# Patient Record
Sex: Female | Born: 1955 | Race: Black or African American | Hispanic: No | Smoking: Current every day smoker
Health system: Southern US, Community
[De-identification: ages and names within clinical notes are randomized; demographics above are authoritative.]

## PROBLEM LIST (undated history)

## (undated) DIAGNOSIS — I10 Essential (primary) hypertension: Secondary | ICD-10-CM

## (undated) HISTORY — PX: TOTAL HIP ARTHROPLASTY: SHX124

## (undated) HISTORY — DX: Essential (primary) hypertension: I10

---
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
## 2000-01-30 ENCOUNTER — Other Ambulatory Visit: Admission: RE | Admit: 2000-01-30 | Discharge: 2000-01-30 | Payer: Self-pay | Admitting: Family Medicine

## 2000-02-05 ENCOUNTER — Encounter: Payer: Self-pay | Admitting: Family Medicine

## 2000-02-05 ENCOUNTER — Encounter: Admission: RE | Admit: 2000-02-05 | Discharge: 2000-02-05 | Payer: Self-pay | Admitting: Family Medicine

## 2008-02-01 ENCOUNTER — Other Ambulatory Visit: Admission: RE | Admit: 2008-02-01 | Discharge: 2008-02-01 | Payer: Self-pay | Admitting: Family Medicine

## 2010-10-18 ENCOUNTER — Inpatient Hospital Stay (INDEPENDENT_AMBULATORY_CARE_PROVIDER_SITE_OTHER)
Admission: RE | Admit: 2010-10-18 | Discharge: 2010-10-18 | Disposition: A | Discharge status: Home / Self Care | Payer: Self-pay | Source: Ambulatory Visit | Attending: Family Medicine | Admitting: Family Medicine

## 2010-10-18 DIAGNOSIS — H81399 Other peripheral vertigo, unspecified ear: Secondary | ICD-10-CM

## 2011-05-14 ENCOUNTER — Inpatient Hospital Stay (INDEPENDENT_AMBULATORY_CARE_PROVIDER_SITE_OTHER)
Admission: RE | Admit: 2011-05-14 | Discharge: 2011-05-14 | Disposition: A | Discharge status: Home / Self Care | Payer: BC Managed Care – PPO | Source: Ambulatory Visit | Attending: Family Medicine | Admitting: Family Medicine

## 2011-05-14 ENCOUNTER — Ambulatory Visit (INDEPENDENT_AMBULATORY_CARE_PROVIDER_SITE_OTHER): Payer: BC Managed Care – PPO

## 2011-05-14 DIAGNOSIS — S92309A Fracture of unspecified metatarsal bone(s), unspecified foot, initial encounter for closed fracture: Secondary | ICD-10-CM

## 2012-12-05 ENCOUNTER — Emergency Department (HOSPITAL_COMMUNITY)
Admission: EM | Admit: 2012-12-05 | Discharge: 2012-12-05 | Disposition: A | Discharge status: Home / Self Care | Payer: BC Managed Care – PPO | Source: Home / Self Care | Attending: Emergency Medicine | Admitting: Emergency Medicine

## 2012-12-05 ENCOUNTER — Encounter (HOSPITAL_COMMUNITY): Payer: Self-pay | Admitting: *Deleted

## 2012-12-05 DIAGNOSIS — I10 Essential (primary) hypertension: Secondary | ICD-10-CM

## 2012-12-05 MED ORDER — VERAPAMIL HCL ER 240 MG PO TBCR: 240.0000 mg | EXTENDED_RELEASE_TABLET | Freq: Every day | ORAL | Status: DC

## 2012-12-05 NOTE — Discharge Instructions (Signed)
Arterial Hypertension °Arterial hypertension (high blood pressure) is a condition of elevated pressure in your blood vessels. Hypertension over a long period of time is a risk factor for strokes, heart attacks, and heart failure. It is also the leading cause of kidney (renal) failure.  °CAUSES  °· In Adults -- Over 90% of all hypertension has no known cause. This is called essential or primary hypertension. In the other 10% of people with hypertension, the increase in blood pressure is caused by another disorder. This is called secondary hypertension. Important causes of secondary hypertension are: °· Heavy alcohol use. °· Obstructive sleep apnea. °· Hyperaldosterosim (Conn's syndrome). °· Steroid use. °· Chronic kidney failure. °· Hyperparathyroidism. °· Medications. °· Renal artery stenosis. °· Pheochromocytoma. °· Cushing's disease. °· Coarctation of the aorta. °· Scleroderma renal crisis. °· Licorice (in excessive amounts). °· Drugs (cocaine, methamphetamine). °Your caregiver can explain any items above that apply to you. °· In Children -- Secondary hypertension is more common and should always be considered. °· Pregnancy -- Few women of childbearing age have high blood pressure. However, up to 10% of them develop hypertension of pregnancy. Generally, this will not harm the woman. It may be a sign of 3 complications of pregnancy: preeclampsia, HELLP syndrome, and eclampsia. Follow up and control with medication is necessary. °SYMPTOMS  °· This condition normally does not produce any noticeable symptoms. It is usually found during a routine exam. °· Malignant hypertension is a late problem of high blood pressure. It may have the following symptoms: °· Headaches. °· Blurred vision. °· End-organ damage (this means your kidneys, heart, lungs, and other organs are being damaged). °· Stressful situations can increase the blood pressure. If a person with normal blood pressure has their blood pressure go up while being  seen by their caregiver, this is often termed "white coat hypertension." Its importance is not known. It may be related with eventually developing hypertension or complications of hypertension. °· Hypertension is often confused with mental tension, stress, and anxiety. °DIAGNOSIS  °The diagnosis is made by 3 separate blood pressure measurements. They are taken at least 1 week apart from each other. If there is organ damage from hypertension, the diagnosis may be made without repeat measurements. °Hypertension is usually identified by having blood pressure readings: °· Above 140/90 mmHg measured in both arms, at 3 separate times, over a couple weeks. °· Over 130/80 mmHg should be considered a risk factor and may require treatment in patients with diabetes. °Blood pressure readings over 120/80 mmHg are called "pre-hypertension" even in non-diabetic patients. °To get a true blood pressure measurement, use the following guidelines. Be aware of the factors that can alter blood pressure readings. °· Take measurements at least 1 hour after caffeine. °· Take measurements 30 minutes after smoking and without any stress. This is another reason to quit smoking  it raises your blood pressure. °· Use a proper cuff size. Ask your caregiver if you are not sure about your cuff size. °· Most home blood pressure cuffs are automatic. They will measure systolic and diastolic pressures. The systolic pressure is the pressure reading at the start of sounds. Diastolic pressure is the pressure at which the sounds disappear. If you are elderly, measure pressures in multiple postures. Try sitting, lying or standing. °· Sit at rest for a minimum of 5 minutes before taking measurements. °· You should not be on any medications like decongestants. These are found in many cold medications. °· Record your blood pressure readings and review   them with your caregiver. °If you have hypertension: °· Your caregiver may do tests to be sure you do not have  secondary hypertension (see "causes" above). °· Your caregiver may also look for signs of metabolic syndrome. This is also called Syndrome X or Insulin Resistance Syndrome. You may have this syndrome if you have type 2 diabetes, abdominal obesity, and abnormal blood lipids in addition to hypertension. °· Your caregiver will take your medical and family history and perform a physical exam. °· Diagnostic tests may include blood tests (for glucose, cholesterol, potassium, and kidney function), a urinalysis, or an EKG. Other tests may also be necessary depending on your condition. °PREVENTION  °There are important lifestyle issues that you can adopt to reduce your chance of developing hypertension: °· Maintain a normal weight. °· Limit the amount of salt (sodium) in your diet. °· Exercise often. °· Limit alcohol intake. °· Get enough potassium in your diet. Discuss specific advice with your caregiver. °· Follow a DASH diet (dietary approaches to stop hypertension). This diet is rich in fruits, vegetables, and low-fat dairy products, and avoids certain fats. °PROGNOSIS  °Essential hypertension cannot be cured. Lifestyle changes and medical treatment can lower blood pressure and reduce complications. The prognosis of secondary hypertension depends on the underlying cause. Many people whose hypertension is controlled with medicine or lifestyle changes can live a normal, healthy life.  °RISKS AND COMPLICATIONS  °While high blood pressure alone is not an illness, it often requires treatment due to its short- and long-term effects on many organs. Hypertension increases your risk for: °· CVAs or strokes (cerebrovascular accident). °· Heart failure due to chronically high blood pressure (hypertensive cardiomyopathy). °· Heart attack (myocardial infarction). °· Damage to the retina (hypertensive retinopathy). °· Kidney failure (hypertensive nephropathy). °Your caregiver can explain list items above that apply to you. Treatment  of hypertension can significantly reduce the risk of complications. °TREATMENT  °· For overweight patients, weight loss and regular exercise are recommended. Physical fitness lowers blood pressure. °· Mild hypertension is usually treated with diet and exercise. A diet rich in fruits and vegetables, fat-free dairy products, and foods low in fat and salt (sodium) can help lower blood pressure. Decreasing salt intake decreases blood pressure in a 1/3 of people. °· Stop smoking if you are a smoker. °The steps above are highly effective in reducing blood pressure. While these actions are easy to suggest, they are difficult to achieve. Most patients with moderate or severe hypertension end up requiring medications to bring their blood pressure down to a normal level. There are several classes of medications for treatment. Blood pressure pills (antihypertensives) will lower blood pressure by their different actions. Lowering the blood pressure by 10 mmHg may decrease the risk of complications by as much as 25%. °The goal of treatment is effective blood pressure control. This will reduce your risk for complications. Your caregiver will help you determine the best treatment for you according to your lifestyle. What is excellent treatment for one person, may not be for you. °HOME CARE INSTRUCTIONS  °· Do not smoke. °· Follow the lifestyle changes outlined in the "Prevention" section. °· If you are on medications, follow the directions carefully. Blood pressure medications must be taken as prescribed. Skipping doses reduces their benefit. It also puts you at risk for problems. °· Follow up with your caregiver, as directed. °· If you are asked to monitor your blood pressure at home, follow the guidelines in the "Diagnosis" section above. °SEEK MEDICAL CARE   HOME CARE INSTRUCTIONS     Do not smoke.   Follow the lifestyle changes outlined in the "Prevention" section.   If you are on medications, follow the directions carefully. Blood pressure medications must be taken as prescribed. Skipping doses reduces their benefit. It also puts you at risk for problems.   Follow up with your caregiver, as directed.   If you are asked to monitor your blood pressure at home, follow the guidelines in the "Diagnosis" section above.  SEEK MEDICAL CARE IF:     You think you are having medication side effects.   You have recurrent headaches or lightheadedness.   You have swelling in your ankles.   You  have trouble with your vision.  SEEK IMMEDIATE MEDICAL CARE IF:     You have sudden onset of chest pain or pressure, difficulty breathing, or other symptoms of a heart attack.   You have a severe headache.   You have symptoms of a stroke (such as sudden weakness, difficulty speaking, difficulty walking).  MAKE SURE YOU:     Understand these instructions.   Will watch your condition.   Will get help right away if you are not doing well or get worse.  Document Released: 08/11/2005 Document Revised: 11/03/2011 Document Reviewed: 03/11/2007  Exit[employer] Patient Information 2013 [employer].

## 2012-12-05 NOTE — ED Notes (Signed)
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 Her old PCP will not see her and she needs refills on her hypertension medication. She states she would have gone elsewhere but she needs the medication now cause she is out. She has an appointment with [facility] at the end of the month.

## 2012-12-05 NOTE — ED Provider Notes (Signed)
History     CSN: [id number]  Arrival date & time 12/05/12  1610   First MD Initiated Contact with Patient 12/05/12 1612      Chief Complaint  Patient presents with  . Hypertension    (Consider location/radiation/quality/duration/timing/severity/associated sxs/prior treatment) HPI Comments: Patient presents urgent care, describing that lately she has been unable to followup with her primary care doctor and has run out of blood pressure medicines. Several of events occurred in her personal life with, her husband being sick and her mother passing away recently that has made her feel stressed. She has already established a appointment with a local cardiologist office. Patient denies any chest pains palpitations or shortness of breath. And no neurological symptoms. She feels this medicine she is requesting a refill at work for her in the past as there was no need to change dosage or add any other medicines. " it was working for me"  Patient is a 57 y.o. female presenting with hypertension. The history is provided by the patient.  Hypertension This is a chronic problem. The problem occurs constantly. The problem has not changed since onset.Pertinent negatives include no chest pain, no abdominal pain, no headaches and no shortness of breath. Nothing aggravates the symptoms. The treatment provided no relief.    History reviewed. No pertinent past medical history.  History reviewed. No pertinent past surgical history.  No family history on file.  History  Substance Use Topics  . Smoking status: Heavy Tobacco Smoker -- 0.50 packs/day    Types: Cigarettes  . Smokeless tobacco: Not on file  . Alcohol Use: Not on file    OB History   Grav Para Term Preterm Abortions TAB SAB Ect Mult Living                  Review of Systems  Constitutional: Negative for chills, diaphoresis, activity change, appetite change and fatigue.  HENT: Negative for neck pain.   Respiratory: Negative for  shortness of breath.   Cardiovascular: Negative for chest pain, palpitations and leg swelling.  Gastrointestinal: Negative for vomiting and abdominal pain.  Skin: Negative for color change.  Neurological: Negative for dizziness, tremors, seizures, syncope, facial asymmetry, speech difficulty, weakness, light-headedness, numbness and headaches.    Allergies  Review of patient's allergies indicates no known allergies.  Home Medications   Current Outpatient Rx  Name  Route  Sig  Dispense  Refill  . verapamil (CALAN-SR) 240 MG CR tablet   Oral   Take 240 mg by mouth at bedtime.         . verapamil (CALAN-SR) 240 MG CR tablet   Oral   Take 1 tablet (240 mg total) by mouth at bedtime.   30 tablet   0     BP 165/88  Pulse 108  Temp(Src) 98.2 F (36.8 C) (Oral)  Resp 20  SpO2 100%  Physical Exam  Constitutional: She is oriented to person, place, and time. Vital signs are normal. She appears well-developed and well-nourished.  Non-toxic appearance. She does not have a sickly appearance. She does not appear ill. No distress.  HENT:  Head: Normocephalic.  Neck: No JVD present.  Cardiovascular: Normal rate, normal heart sounds and normal pulses.   No extrasystoles are present. Exam reveals no gallop and no friction rub.   No murmur heard. Pulmonary/Chest: Effort normal and breath sounds normal.  Abdominal: Soft.  Neurological: She is alert and oriented to person, place, and time. No cranial nerve deficit. She exhibits normal  muscle tone.  Skin: No rash noted. No erythema.    ED Course  Procedures (including critical care time)  Labs Reviewed - No data to display No results found.   1. Hypertension       MDM  Hypertension, the patient asymptomatic. Requesting medicine refills- with a followup appointment meds may with [facility] cardiology.       Jimmie Molly, MD 12/05/12 [phone]

## 2013-01-07 NOTE — ED Notes (Signed)
Accessed record to address message left by patient.  Spoke to dr Artis Flock and received order for refill of medicine refill: [facility] at [phone].  Verapamil 240mg  cr take one tablet by mouth at bedtime, 30 tablets, no refill per dr Artis Flock.

## 2013-01-11 ENCOUNTER — Encounter: Payer: Self-pay | Admitting: Internal Medicine

## 2013-01-11 ENCOUNTER — Ambulatory Visit (INDEPENDENT_AMBULATORY_CARE_PROVIDER_SITE_OTHER): Payer: BC Managed Care – PPO | Admitting: Internal Medicine

## 2013-01-11 VITALS — BP 118/78 | HR 76 | Temp 98.0°F | Ht 63.5 in | Wt 218.4 lb

## 2013-01-11 DIAGNOSIS — I10 Essential (primary) hypertension: Secondary | ICD-10-CM | POA: Insufficient documentation

## 2013-01-11 DIAGNOSIS — F17200 Nicotine dependence, unspecified, uncomplicated: Secondary | ICD-10-CM

## 2013-01-11 DIAGNOSIS — E669 Obesity, unspecified: Secondary | ICD-10-CM

## 2013-01-11 DIAGNOSIS — L309 Dermatitis, unspecified: Secondary | ICD-10-CM | POA: Insufficient documentation

## 2013-01-11 DIAGNOSIS — Z23 Encounter for immunization: Secondary | ICD-10-CM

## 2013-01-11 DIAGNOSIS — L259 Unspecified contact dermatitis, unspecified cause: Secondary | ICD-10-CM

## 2013-01-11 NOTE — Patient Instructions (Signed)

## 2013-01-11 NOTE — Assessment & Plan Note (Signed)
Work on diet and exercise 

## 2013-01-11 NOTE — Assessment & Plan Note (Signed)
Continue triamcinolone cream.

## 2013-01-11 NOTE — Addendum Note (Signed)
Addended by: Carin Primrose on: 01/11/2013 10:27 AM   Modules accepted: Orders

## 2013-01-11 NOTE — Assessment & Plan Note (Signed)
Well controlled Continue verapamil Avoid salt in your diet

## 2013-01-11 NOTE — Progress Notes (Signed)
HPI  Pt presents to the clinic today to establish care. She is transferring care from Dr. Clovis Riley at [facility]. She does have a history of hypertension and eczema which are both well controlled on medication. She has no concerns today.  Flu: 08/2011 Tetanus: 2004 Eye doctor: yearly Dentist: yearly Mammogram: 2005 LMP: post menopausal Pap Smear: 2013 Colonoscopy: never  Past Medical History  Diagnosis Date  . Hypertension     Current Outpatient Prescriptions  Medication Sig Dispense Refill  . triamcinolone cream (KENALOG) 0.5 % Apply topically 2 (two) times daily as needed.      . verapamil (CALAN-SR) 240 MG CR tablet Take 240 mg by mouth at bedtime.       No current facility-administered medications for this visit.    No Known Allergies  Family History  Problem Relation Age of Onset  . Hypertension Mother   . Hyperlipidemia Mother   . Hypertension Father   . Hyperlipidemia Father   . Hypertension Sister   . Hyperlipidemia Sister   . Hypertension Brother   . Hyperlipidemia Brother   . Cancer Neg Hx   . Diabetes Neg Hx   . Stroke Neg Hx     History   Social History  . Marital Status: Married    Spouse Name: N/A    Number of Children: N/A  . Years of Education: N/A   Occupational History  . Retired [employer]   Social History Main Topics  . Smoking status: Current Every Day Smoker -- 0.50 packs/day for 40 years    Types: Cigarettes  . Smokeless tobacco: Not on file  . Alcohol Use: No  . Drug Use: No  . Sexually Active: Yes   Other Topics Concern  . Not on file   Social History Narrative  . No narrative on file    ROS:  Constitutional: Denies fever, malaise, fatigue, headache or abrupt weight changes.  HEENT: Denies eye pain, eye redness, ear pain, ringing in the ears, wax buildup, runny nose, nasal congestion, bloody nose, or sore throat. Respiratory: Denies difficulty breathing, shortness of breath, cough or sputum  production.   Cardiovascular: Denies chest pain, chest tightness, palpitations or swelling in the hands or feet.  Gastrointestinal: Denies abdominal pain, bloating, constipation, diarrhea or blood in the stool.  GU: Denies frequency, urgency, pain with urination, blood in urine, odor or discharge. Musculoskeletal: Denies decrease in range of motion, difficulty with gait, muscle pain or joint pain and swelling.  Skin: Denies redness, rashes, lesions or ulcercations.  Neurological: Denies dizziness, difficulty with memory, difficulty with speech or problems with balance and coordination.   No other specific complaints in a complete review of systems (except as listed in HPI above).  PE:  BP 118/78  Pulse 76  Temp(Src) 98 F (36.7 C) (Oral)  Ht 5' 3.5" (1.613 m)  Wt 218 lb 6.4 oz (99.066 kg)  BMI 38.08 kg/m2  SpO2 97% Wt Readings from Last 3 Encounters:  01/11/13 218 lb 6.4 oz (99.066 kg)    General: Appears her stated age, obese but well developed, well nourished in NAD. HEENT: Head: normal shape and size; Eyes: sclera white, no icterus, conjunctiva pink, PERRLA and EOMs intact; Ears: Tm's gray and intact, normal light reflex; Nose: mucosa pink and moist, septum midline; Throat/Mouth: Teeth present, mucosa pink and moist, no lesions or ulcerations noted.  Neck: Normal range of motion. Neck supple, trachea midline. No massses, lumps or thyromegaly present.  Cardiovascular: Normal rate and rhythm. S1,S2  noted.  No murmur, rubs or gallops noted. No JVD or BLE edema. No carotid bruits noted. Pulmonary/Chest: Normal effort and positive vesicular breath sounds. No respiratory distress. No wheezes, rales or ronchi noted.  Abdomen: Soft and nontender. Normal bowel sounds, no bruits noted. No distention or masses noted. Liver, spleen and kidneys non palpable. Musculoskeletal: Normal range of motion. No signs of joint swelling. No difficulty with gait.  Neurological: Alert and oriented. Cranial  nerves II-XII intact. Coordination normal. +DTRs bilaterally. Psychiatric: Mood and affect normal. Behavior is normal. Judgment and thought content normal.      Assessment and Plan:  Preventative Health Maintenance:  Tdap given today Will set up mammogram today Pt not ready to set up colonoscopy at this time Continue to work on diet and exercise Smoking cessation counseling given approx 5 minutes, pt not ready to quit at this time

## 2013-01-11 NOTE — Assessment & Plan Note (Signed)
Counseling given. 

## 2013-01-26 ENCOUNTER — Other Ambulatory Visit: Payer: Self-pay | Admitting: *Deleted

## 2013-01-26 MED ORDER — VERAPAMIL HCL ER 240 MG PO TBCR: 240.0000 mg | EXTENDED_RELEASE_TABLET | Freq: Every day | ORAL | Status: DC

## 2013-07-14 ENCOUNTER — Ambulatory Visit: Payer: BC Managed Care – PPO | Admitting: Internal Medicine

## 2013-07-15 ENCOUNTER — Ambulatory Visit (INDEPENDENT_AMBULATORY_CARE_PROVIDER_SITE_OTHER): Payer: BC Managed Care – PPO | Admitting: Nurse Practitioner

## 2013-07-15 ENCOUNTER — Encounter: Payer: Self-pay | Admitting: Nurse Practitioner

## 2013-07-15 VITALS — BP 140/100 | HR 85 | Temp 98.0°F | Ht 63.5 in | Wt 218.2 lb

## 2013-07-15 DIAGNOSIS — L309 Dermatitis, unspecified: Secondary | ICD-10-CM

## 2013-07-15 DIAGNOSIS — Z1239 Encounter for other screening for malignant neoplasm of breast: Secondary | ICD-10-CM

## 2013-07-15 DIAGNOSIS — R0989 Other specified symptoms and signs involving the circulatory and respiratory systems: Secondary | ICD-10-CM

## 2013-07-15 DIAGNOSIS — L259 Unspecified contact dermatitis, unspecified cause: Secondary | ICD-10-CM

## 2013-07-15 DIAGNOSIS — Z0000 Encounter for general adult medical examination without abnormal findings: Secondary | ICD-10-CM

## 2013-07-15 DIAGNOSIS — I10 Essential (primary) hypertension: Secondary | ICD-10-CM

## 2013-07-15 DIAGNOSIS — Z23 Encounter for immunization: Secondary | ICD-10-CM

## 2013-07-15 MED ORDER — FLUTICASONE PROPIONATE 50 MCG/ACT NA SUSP: NASAL | Status: DC

## 2013-07-15 MED ORDER — TRIAMCINOLONE ACETONIDE 0.5 % EX CREA: TOPICAL_CREAM | Freq: Two times a day (BID) | CUTANEOUS | Status: DC | PRN

## 2013-07-15 MED ORDER — VERAPAMIL HCL ER 240 MG PO TBCR: 240.0000 mg | EXTENDED_RELEASE_TABLET | Freq: Every day | ORAL | Status: DC

## 2013-07-15 NOTE — Progress Notes (Signed)
Pre-visit discussion using our clinic review tool. No additional management support is needed unless otherwise documented below in the visit note.  

## 2013-07-15 NOTE — Patient Instructions (Signed)
Please get mammogram and return for pap and pelvic exam. Please get labs done at your earliest convenience. Please seriously consider making diet and exercise changes to decrease your health risks for heart disease, cancer, stroke, and diabetes. Exercise goal is 30 minutes daily-take a walk. Calorie goal is 1800 calories daily to lose 2 to 4 pounds each week. A small weight loss of 5 pounds can lower your blood pressure. If pressure does not come down, we will need to make medication changes to better control your blood pressure.   For sinuses: start daily sinus rinse-Neilmed Sinus Rinse. Use twice daily. If no improvement in 2 weeks with headache & full feeling in ears, please call us for re-evaluation. Pleasure to meet you!

## 2013-07-17 ENCOUNTER — Encounter: Payer: Self-pay | Admitting: Nurse Practitioner

## 2013-07-17 NOTE — Progress Notes (Signed)
Subjective:    Patient here for follow-up of elevated blood pressure.  She is not exercising and is adherent to a low-salt diet.  Blood pressure is not well controlled at home. Cardiac symptoms: none. Patient denies: chest pain, irregular heart beat, lower extremity edema and near-syncope. Cardiovascular risk factors: hypertension, obesity (BMI >= 30 kg/m2) and sedentary lifestyle. Use of agents associated with hypertension: none. History of target organ damage: none. Additionally, pt c/o ear fullness & sinus pain.  The following portions of the patient's history were reviewed and updated as appropriate: allergies, current medications, past family history, past medical history, past social history, past surgical history and problem list.  Review of Systems Constitutional: negative for chills, fatigue, fevers, night sweats and weight loss Eyes: negative for visual disturbance Ears, nose, mouth, throat, and face: c/o frontal sinus pressure & R-sided occipital pressure  Respiratory: negative for cough, sputum and wheezing Cardiovascular: negative for chest pain, irregular heart beat, lower extremity edema and near-syncope Genitourinary:last pap '07-pt reports nml. Has Hx abnml pap per pt. Allergic/Immunologic: negative     Objective:    BP 140/100  Pulse 85  Temp(Src) 98 F (36.7 C) (Oral)  Ht 5' 3.5" (1.613 m)  Wt 218 lb 4 oz (98.998 kg)  BMI 38.05 kg/m2  SpO2 96% General appearance: alert, cooperative, appears stated age, no distress and moderately obese Head: Normocephalic, without obvious abnormality, atraumatic Eyes: negative findings: lids and lashes normal and conjunctivae and sclerae normal Ears: retracted TM bilateral Nose: erythematous emlarged turbinates Throat: lips, mucosa, and tongue normal; teeth and gums normal and upper dentures Neck: no adenopathy, no carotid bruit, supple, symmetrical, trachea midline and thyroid not enlarged, symmetric, no  tenderness/mass/nodules Lungs: clear to auscultation bilaterally Heart: regular rate and rhythm, S1, S2 normal, no murmur, click, rub or gallop Extremities: extremities normal, atraumatic, no cyanosis or edema Pulses: 2+ and symmetric Lymph nodes: Cervical, supraclavicular, and axillary nodes normal.    Assessment:    Hypertension, stage 1 elevated in office. Current meds verapamil 240mg . Pt took HCTZ in past-caused severe leg cramps. Evidence of target organ damage: none.  Sinusitis BMI 38 Prev care-needs pap, mmg, flu vaccine.    Plan:     1 sodium restritction, weight loss, exercise, continue meds. Labs today. F/u 3 mos. 2 sinus rinses, flonase 3 1800 cals daily, 30 minutes exercise daily for 2-4 pound weight loss ea. Week 4 sched f/u appt for pap. Order mmg today. Flu admin today.

## 2013-08-31 ENCOUNTER — Ambulatory Visit
Admission: RE | Admit: 2013-08-31 | Discharge: 2013-08-31 | Disposition: A | Discharge status: Home / Self Care | Payer: BC Managed Care – PPO | Source: Ambulatory Visit | Attending: Nurse Practitioner | Admitting: Nurse Practitioner

## 2013-08-31 DIAGNOSIS — Z1239 Encounter for other screening for malignant neoplasm of breast: Secondary | ICD-10-CM

## 2014-02-17 ENCOUNTER — Other Ambulatory Visit (INDEPENDENT_AMBULATORY_CARE_PROVIDER_SITE_OTHER): Payer: BC Managed Care – PPO

## 2014-02-17 ENCOUNTER — Other Ambulatory Visit (HOSPITAL_COMMUNITY)
Admission: RE | Admit: 2014-02-17 | Discharge: 2014-02-17 | Disposition: A | Discharge status: Home / Self Care | Payer: BC Managed Care – PPO | Source: Ambulatory Visit | Attending: Internal Medicine | Admitting: Internal Medicine

## 2014-02-17 ENCOUNTER — Encounter: Payer: Self-pay | Admitting: Internal Medicine

## 2014-02-17 ENCOUNTER — Ambulatory Visit (INDEPENDENT_AMBULATORY_CARE_PROVIDER_SITE_OTHER): Payer: BC Managed Care – PPO | Admitting: Internal Medicine

## 2014-02-17 VITALS — BP 118/74 | HR 87 | Temp 97.9°F | Resp 16 | Ht 63.5 in | Wt 221.0 lb

## 2014-02-17 DIAGNOSIS — I10 Essential (primary) hypertension: Secondary | ICD-10-CM

## 2014-02-17 DIAGNOSIS — Z0000 Encounter for general adult medical examination without abnormal findings: Secondary | ICD-10-CM | POA: Insufficient documentation

## 2014-02-17 DIAGNOSIS — Z01419 Encounter for gynecological examination (general) (routine) without abnormal findings: Secondary | ICD-10-CM | POA: Insufficient documentation

## 2014-02-17 DIAGNOSIS — Z23 Encounter for immunization: Secondary | ICD-10-CM

## 2014-02-17 LAB — LIPID PANEL
Cholesterol: 212 mg/dL — ABNORMAL HIGH (ref 0–200)
HDL: 44.5 mg/dL (ref >39.00)
LDL Cholesterol: 151 mg/dL — ABNORMAL HIGH (ref 0–99)
NonHDL: 167.5
Total CHOL/HDL Ratio: 5
Triglycerides: 85 mg/dL (ref 0.0–149.0)
VLDL: 17 mg/dL (ref 0.0–40.0)

## 2014-02-17 LAB — CBC WITH DIFFERENTIAL/PLATELET
Basophils Absolute: 0 10*3/uL (ref 0.0–0.1)
Basophils Relative: 0.1 % (ref 0.0–3.0)
Eosinophils Absolute: 0.1 10*3/uL (ref 0.0–0.7)
Eosinophils Relative: 0.8 % (ref 0.0–5.0)
HEMATOCRIT: 39 % (ref 36.0–46.0)
HEMOGLOBIN: 12.7 g/dL (ref 12.0–15.0)
LYMPHS ABS: 2.5 10*3/uL (ref 0.7–4.0)
Lymphocytes Relative: 18.4 % (ref 12.0–46.0)
MCHC: 32.7 g/dL (ref 30.0–36.0)
MCV: 80.3 fl (ref 78.0–100.0)
MONO ABS: 0.6 10*3/uL (ref 0.1–1.0)
MONOS PCT: 4.1 % (ref 3.0–12.0)
NEUTROS ABS: 10.2 10*3/uL — AB (ref 1.4–7.7)
Neutrophils Relative %: 76.6 % (ref 43.0–77.0)
PLATELETS: 364 10*3/uL (ref 150.0–400.0)
RBC: 4.85 Mil/uL (ref 3.87–5.11)
RDW: 14.8 % (ref 11.5–15.5)
WBC: 13.4 10*3/uL — ABNORMAL HIGH (ref 4.0–10.5)

## 2014-02-17 LAB — COMPREHENSIVE METABOLIC PANEL
ALT: 18 U/L (ref 0–35)
AST: 20 U/L (ref 0–37)
Albumin: 3.9 g/dL (ref 3.5–5.2)
Alkaline Phosphatase: 124 U/L — ABNORMAL HIGH (ref 39–117)
BILIRUBIN TOTAL: 0.6 mg/dL (ref 0.2–1.2)
BUN: 14 mg/dL (ref 6–23)
CO2: 26 mEq/L (ref 19–32)
CREATININE: 1.3 mg/dL — AB (ref 0.4–1.2)
Calcium: 9.5 mg/dL (ref 8.4–10.5)
Chloride: 104 mEq/L (ref 96–112)
GFR: 53.65 mL/min — ABNORMAL LOW (ref >60.00)
Glucose, Bld: 106 mg/dL — ABNORMAL HIGH (ref 70–99)
Potassium: 4.1 mEq/L (ref 3.5–5.1)
Sodium: 138 mEq/L (ref 135–145)
TOTAL PROTEIN: 7.6 g/dL (ref 6.0–8.3)

## 2014-02-17 LAB — TSH: TSH: 1.73 u[IU]/mL (ref 0.35–4.50)

## 2014-02-17 MED ORDER — VERAPAMIL HCL ER 240 MG PO TBCR: 240.0000 mg | EXTENDED_RELEASE_TABLET | Freq: Every day | ORAL | Status: DC

## 2014-02-17 NOTE — Progress Notes (Signed)
Pre visit review using our clinic review tool, if applicable. No additional management support is needed unless otherwise documented below in the visit note. 

## 2014-02-17 NOTE — Patient Instructions (Signed)
Hypertension Hypertension, commonly called high blood pressure, is when the force of blood pumping through your arteries is too strong. Your arteries are the blood vessels that carry blood from your heart throughout your body. A blood pressure reading consists of a higher number over a lower number, such as 110/72. The higher number (systolic) is the pressure inside your arteries when your heart pumps. The lower number (diastolic) is the pressure inside your arteries when your heart relaxes. Ideally you want your blood pressure below 120/80. Hypertension forces your heart to work harder to pump blood. Your arteries may become narrow or stiff. Having hypertension puts you at risk for heart disease, stroke, and other problems.  RISK FACTORS Some risk factors for high blood pressure are controllable. Others are not.  Risk factors you cannot control include:   Race. You may be at higher risk if you are African American.  Age. Risk increases with age.  Gender. Men are at higher risk than women before age 58 years. After age 72, women are at higher risk than men. Risk factors you can control include:  Not getting enough exercise or physical activity.  Being overweight.  Getting too much fat, sugar, calories, or salt in your diet.  Drinking too much alcohol. SIGNS AND SYMPTOMS Hypertension does not usually cause signs or symptoms. Extremely high blood pressure (hypertensive crisis) may cause headache, anxiety, shortness of breath, and nosebleed. DIAGNOSIS  To check if you have hypertension, your health care provider will measure your blood pressure while you are seated, with your arm held at the level of your heart. It should be measured at least twice using the same arm. Certain conditions can cause a difference in blood pressure between your right and left arms. A blood pressure reading that is higher than normal on one occasion does not mean that you need treatment. If one blood pressure reading  is high, ask your health care provider about having it checked again. TREATMENT  Treating high blood pressure includes making lifestyle changes and possibly taking medication. Living a healthy lifestyle can help lower high blood pressure. You may need to change some of your habits. Lifestyle changes may include:  Following the DASH diet. This diet is high in fruits, vegetables, and whole grains. It is low in salt, red meat, and added sugars.  Getting at least 2 1/2 hours of brisk physical activity every week.  Losing weight if necessary.  Not smoking.  Limiting alcoholic beverages.  Learning ways to reduce stress. If lifestyle changes are not enough to get your blood pressure under control, your health care provider may prescribe medicine. You may need to take more than one. Work closely with your health care provider to understand the risks and benefits. HOME CARE INSTRUCTIONS  Have your blood pressure rechecked as directed by your health care provider.   Only take medicine as directed by your health care provider. Follow the directions carefully. Blood pressure medicines must be taken as prescribed. The medicine does not work as well when you skip doses. Skipping doses also puts you at risk for problems.   Do not smoke.   Monitor your blood pressure at home as directed by your health care provider. SEEK MEDICAL CARE IF:   You think you are having a reaction to medicines taken.  You have recurrent headaches or feel dizzy.  You have swelling in your ankles.  You have trouble with your vision. SEEK IMMEDIATE MEDICAL CARE IF:  You develop a severe headache or  confusion.  You have unusual weakness, numbness, or feel faint.  You have severe chest or abdominal pain.  You vomit repeatedly.  You have trouble breathing. MAKE SURE YOU:   Understand these instructions.  Will watch your condition.  Will get help right away if you are not doing well or get  worse. Document Released: 08/11/2005 Document Revised: 08/16/2013 Document Reviewed: 06/03/2013 [facility] Patient Information 2015 [place], [place]. This information is not intended to replace advice given to you by your health care provider. Make sure you discuss any questions you have with your health care provider. Preventive Care for Adults A healthy lifestyle and preventive care can promote health and wellness. Preventive health guidelines for women include the following key practices.  A routine yearly physical is a good way to check with your health care provider about your health and preventive screening. It is a chance to share any concerns and updates on your health and to receive a thorough exam.  Visit your dentist for a routine exam and preventive care every 6 months. Brush your teeth twice a day and floss once a day. Good oral hygiene prevents tooth decay and gum disease.  The frequency of eye exams is based on your age, health, family medical history, use of contact lenses, and other factors. Follow your health care provider's recommendations for frequency of eye exams.  Eat a healthy diet. Foods like vegetables, fruits, whole grains, low-fat dairy products, and lean protein foods contain the nutrients you need without too many calories. Decrease your intake of foods high in solid fats, added sugars, and salt. Eat the right amount of calories for you.Get information about a proper diet from your health care provider, if necessary.  Regular physical exercise is one of the most important things you can do for your health. Most adults should get at least 150 minutes of moderate-intensity exercise (any activity that increases your heart rate and causes you to sweat) each week. In addition, most adults need muscle-strengthening exercises on 2 or more days a week.  Maintain a healthy weight. The body mass index (BMI) is a screening tool to identify possible weight problems. It provides an  estimate of body fat based on height and weight. Your health care provider can find your BMI, and can help you achieve or maintain a healthy weight.For adults 20 years and older:  A BMI below 18.5 is considered underweight.  A BMI of 18.5 to 24.9 is normal.  A BMI of 25 to 29.9 is considered overweight.  A BMI of 30 and above is considered obese.  Maintain normal blood lipids and cholesterol levels by exercising and minimizing your intake of saturated fat. Eat a balanced diet with plenty of fruit and vegetables. Blood tests for lipids and cholesterol should begin at age 54 and be repeated every 5 years. If your lipid or cholesterol levels are high, you are over 50, or you are at high risk for heart disease, you may need your cholesterol levels checked more frequently.Ongoing high lipid and cholesterol levels should be treated with medicines if diet and exercise are not working.  If you smoke, find out from your health care provider how to quit. If you do not use tobacco, do not start.  Lung cancer screening is recommended for adults aged 72-80 years who are at high risk for developing lung cancer because of a history of smoking. A yearly low-dose CT scan of the lungs is recommended for people who have at least a 30-pack-year history  of smoking and are a current smoker or have quit within the past 15 years. A pack year of smoking is smoking an average of 1 pack of cigarettes a day for 1 year (for example: 1 pack a day for 30 years or 2 packs a day for 15 years). Yearly screening should continue until the smoker has stopped smoking for at least 15 years. Yearly screening should be stopped for people who develop a health problem that would prevent them from having lung cancer treatment.  If you are pregnant, do not drink alcohol. If you are breastfeeding, be very cautious about drinking alcohol. If you are not pregnant and choose to drink alcohol, do not have more than 1 drink per day. One drink is  considered to be 12 ounces (355 mL) of beer, 5 ounces (148 mL) of wine, or 1.5 ounces (44 mL) of liquor.  Avoid use of street drugs. Do not share needles with anyone. Ask for help if you need support or instructions about stopping the use of drugs.  High blood pressure causes heart disease and increases the risk of stroke. Your blood pressure should be checked at least every 1 to 2 years. Ongoing high blood pressure should be treated with medicines if weight loss and exercise do not work.  If you are 56-49 years old, ask your health care provider if you should take aspirin to prevent strokes.  Diabetes screening involves taking a blood sample to check your fasting blood sugar level. This should be done once every 3 years, after age 85, if you are within normal weight and without risk factors for diabetes. Testing should be considered at a younger age or be carried out more frequently if you are overweight and have at least 1 risk factor for diabetes.  Breast cancer screening is essential preventive care for women. You should practice "breast self-awareness." This means understanding the normal appearance and feel of your breasts and may include breast self-examination. Any changes detected, no matter how small, should be reported to a health care provider. Women in their 57s and 30s should have a clinical breast exam (CBE) by a health care provider as part of a regular health exam every 1 to 3 years. After age 108, women should have a CBE every year. Starting at age 39, women should consider having a mammogram (breast X-ray test) every year. Women who have a family history of breast cancer should talk to their health care provider about genetic screening. Women at a high risk of breast cancer should talk to their health care providers about having an MRI and a mammogram every year.  Breast cancer gene (BRCA)-related cancer risk assessment is recommended for women who have family members with BRCA-related  cancers. BRCA-related cancers include breast, ovarian, tubal, and peritoneal cancers. Having family members with these cancers may be associated with an increased risk for harmful changes (mutations) in the breast cancer genes BRCA1 and BRCA2. Results of the assessment will determine the need for genetic counseling and BRCA1 and BRCA2 testing.  Routine pelvic exams to screen for cancer are no longer recommended for nonpregnant women who are considered low risk for cancer of the pelvic organs (ovaries, uterus, and vagina) and who do not have symptoms. Ask your health care provider if a screening pelvic exam is right for you.  If you have had past treatment for cervical cancer or a condition that could lead to cancer, you need Pap tests and screening for cancer for at least 20  years after your treatment. If Pap tests have been discontinued, your risk factors (such as having a new sexual partner) need to be reassessed to determine if screening should be resumed. Some women have medical problems that increase the chance of getting cervical cancer. In these cases, your health care provider may recommend more frequent screening and Pap tests.  The HPV test is an additional test that may be used for cervical cancer screening. The HPV test looks for the virus that can cause the cell changes on the cervix. The cells collected during the Pap test can be tested for HPV. The HPV test could be used to screen women aged 38 years and older, and should be used in women of any age who have unclear Pap test results. After the age of 35, women should have HPV testing at the same frequency as a Pap test.  Colorectal cancer can be detected and often prevented. Most routine colorectal cancer screening begins at the age of 36 years and continues through age 71 years. However, your health care provider may recommend screening at an earlier age if you have risk factors for colon cancer. On a yearly basis, your health care provider  may provide home test kits to check for hidden blood in the stool. Use of a small camera at the end of a tube, to directly examine the colon (sigmoidoscopy or colonoscopy), can detect the earliest forms of colorectal cancer. Talk to your health care provider about this at age 19, when routine screening begins. Direct exam of the colon should be repeated every 5-10 years through age 70 years, unless early forms of pre-cancerous polyps or small growths are found.  People who are at an increased risk for hepatitis B should be screened for this virus. You are considered at high risk for hepatitis B if:  You were born in a country where hepatitis B occurs often. Talk with your health care provider about which countries are considered high risk.  Your parents were born in a high-risk country and you have not received a shot to protect against hepatitis B (hepatitis B vaccine).  You have HIV or AIDS.  You use needles to inject street drugs.  You live with, or have sex with, someone who has Hepatitis B.  You get hemodialysis treatment.  You take certain medicines for conditions like cancer, organ transplantation, and autoimmune conditions.  Hepatitis C blood testing is recommended for all people born from [birth year] through 1965 and any individual with known risks for hepatitis C.  Practice safe sex. Use condoms and avoid high-risk sexual practices to reduce the spread of sexually transmitted infections (STIs). STIs include gonorrhea, chlamydia, syphilis, trichomonas, herpes, HPV, and human immunodeficiency virus (HIV). Herpes, HIV, and HPV are viral illnesses that have no cure. They can result in disability, cancer, and death.  You should be screened for sexually transmitted illnesses (STIs) including gonorrhea and chlamydia if:  You are sexually active and are younger than 24 years.  You are older than 24 years and your health care provider tells you that you are at risk for this type of  infection.  Your sexual activity has changed since you were last screened and you are at an increased risk for chlamydia or gonorrhea. Ask your health care provider if you are at risk.  If you are at risk of being infected with HIV, it is recommended that you take a prescription medicine daily to prevent HIV infection. This is called preexposure prophylaxis (PrEP). You  are considered at risk if:  You are a heterosexual woman, are sexually active, and are at increased risk for HIV infection.  You take drugs by injection.  You are sexually active with a partner who has HIV.  Talk with your health care provider about whether you are at high risk of being infected with HIV. If you choose to begin PrEP, you should first be tested for HIV. You should then be tested every 3 months for as long as you are taking PrEP.  Osteoporosis is a disease in which the bones lose minerals and strength with aging. This can result in serious bone fractures or breaks. The risk of osteoporosis can be identified using a bone density scan. Women ages 58 years and over and women at risk for fractures or osteoporosis should discuss screening with their health care providers. Ask your health care provider whether you should take a calcium supplement or vitamin D to reduce the rate of osteoporosis.  Menopause can be associated with physical symptoms and risks. Hormone replacement therapy is available to decrease symptoms and risks. You should talk to your health care provider about whether hormone replacement therapy is right for you.  Use sunscreen. Apply sunscreen liberally and repeatedly throughout the day. You should seek shade when your shadow is shorter than you. Protect yourself by wearing long sleeves, pants, a wide-brimmed hat, and sunglasses year round, whenever you are outdoors.  Once a month, do a whole body skin exam, using a mirror to look at the skin on your back. Tell your health care provider of new moles,  moles that have irregular borders, moles that are larger than a pencil eraser, or moles that have changed in shape or color.  Stay current with required vaccines (immunizations).  Influenza vaccine. All adults should be immunized every year.  Tetanus, diphtheria, and acellular pertussis (Td, Tdap) vaccine. Pregnant women should receive 1 dose of Tdap vaccine during each pregnancy. The dose should be obtained regardless of the length of time since the last dose. Immunization is preferred during the 27th-36th week of gestation. An adult who has not previously received Tdap or who does not know her vaccine status should receive 1 dose of Tdap. This initial dose should be followed by tetanus and diphtheria toxoids (Td) booster doses every 10 years. Adults with an unknown or incomplete history of completing a 3-dose immunization series with Td-containing vaccines should begin or complete a primary immunization series including a Tdap dose. Adults should receive a Td booster every 10 years.  Varicella vaccine. An adult without evidence of immunity to varicella should receive 2 doses or a second dose if she has previously received 1 dose. Pregnant females who do not have evidence of immunity should receive the first dose after pregnancy. This first dose should be obtained before leaving the health care facility. The second dose should be obtained 4-8 weeks after the first dose.  Human papillomavirus (HPV) vaccine. Females aged 13-26 years who have not received the vaccine previously should obtain the 3-dose series. The vaccine is not recommended for use in pregnant females. However, pregnancy testing is not needed before receiving a dose. If a female is found to be pregnant after receiving a dose, no treatment is needed. In that case, the remaining doses should be delayed until after the pregnancy. Immunization is recommended for any person with an immunocompromised condition through the age of 31 years if she  did not get any or all doses earlier. During the 3-dose series,  the second dose should be obtained 4-8 weeks after the first dose. The third dose should be obtained 24 weeks after the first dose and 16 weeks after the second dose.  Zoster vaccine. One dose is recommended for adults aged 28 years or older unless certain conditions are present.  Measles, mumps, and rubella (MMR) vaccine. Adults born before [birth year] generally are considered immune to measles and mumps. Adults born in [birth year] or later should have 1 or more doses of MMR vaccine unless there is a contraindication to the vaccine or there is laboratory evidence of immunity to each of the three diseases. A routine second dose of MMR vaccine should be obtained at least 28 days after the first dose for students attending postsecondary schools, health care workers, or international travelers. People who received inactivated measles vaccine or an unknown type of measles vaccine during 1963-1967 should receive 2 doses of MMR vaccine. People who received inactivated mumps vaccine or an unknown type of mumps vaccine before 1979 and are at high risk for mumps infection should consider immunization with 2 doses of MMR vaccine. For females of childbearing age, rubella immunity should be determined. If there is no evidence of immunity, females who are not pregnant should be vaccinated. If there is no evidence of immunity, females who are pregnant should delay immunization until after pregnancy. Unvaccinated health care workers born before 30 who lack laboratory evidence of measles, mumps, or rubella immunity or laboratory confirmation of disease should consider measles and mumps immunization with 2 doses of MMR vaccine or rubella immunization with 1 dose of MMR vaccine.  Pneumococcal 13-valent conjugate (PCV13) vaccine. When indicated, a person who is uncertain of her immunization history and has no record of immunization should receive the PCV13 vaccine. An adult  aged 33 years or older who has certain medical conditions and has not been previously immunized should receive 1 dose of PCV13 vaccine. This PCV13 should be followed with a dose of pneumococcal polysaccharide (PPSV23) vaccine. The PPSV23 vaccine dose should be obtained at least 8 weeks after the dose of PCV13 vaccine. An adult aged 77 years or older who has certain medical conditions and previously received 1 or more doses of PPSV23 vaccine should receive 1 dose of PCV13. The PCV13 vaccine dose should be obtained 1 or more years after the last PPSV23 vaccine dose.  Pneumococcal polysaccharide (PPSV23) vaccine. When PCV13 is also indicated, PCV13 should be obtained first. All adults aged 11 years and older should be immunized. An adult younger than age 55 years who has certain medical conditions should be immunized. Any person who resides in a nursing home or long-term care facility should be immunized. An adult smoker should be immunized. People with an immunocompromised condition and certain other conditions should receive both PCV13 and PPSV23 vaccines. People with human immunodeficiency virus (HIV) infection should be immunized as soon as possible after diagnosis. Immunization during chemotherapy or radiation therapy should be avoided. Routine use of PPSV23 vaccine is not recommended for American Indians, [place] Natives, or people younger than 65 years unless there are medical conditions that require PPSV23 vaccine. When indicated, people who have unknown immunization and have no record of immunization should receive PPSV23 vaccine. One-time revaccination 5 years after the first dose of PPSV23 is recommended for people aged 19-64 years who have chronic kidney failure, nephrotic syndrome, asplenia, or immunocompromised conditions. People who received 1-2 doses of PPSV23 before age 3 years should receive another dose of PPSV23 vaccine at age 70 years or  later if at least 5 years have passed since the previous  dose. Doses of PPSV23 are not needed for people immunized with PPSV23 at or after age 41 years.  Meningococcal vaccine. Adults with asplenia or persistent complement component deficiencies should receive 2 doses of quadrivalent meningococcal conjugate (MenACWY-D) vaccine. The doses should be obtained at least 2 months apart. Microbiologists working with certain meningococcal bacteria, [employer] recruits, people at risk during an outbreak, and people who travel to or live in countries with a high rate of meningitis should be immunized. A first-year college student up through age 21 years who is living in a residence hall should receive a dose if she did not receive a dose on or after her 16th birthday. Adults who have certain high-risk conditions should receive one or more doses of vaccine.  Hepatitis A vaccine. Adults who wish to be protected from this disease, have certain high-risk conditions, work with hepatitis A-infected animals, work in hepatitis A research labs, or travel to or work in countries with a high rate of hepatitis A should be immunized. Adults who were previously unvaccinated and who anticipate close contact with an international adoptee during the first 60 days after arrival in the [place] from a country with a high rate of hepatitis A should be immunized.  Hepatitis B vaccine. Adults who wish to be protected from this disease, have certain high-risk conditions, may be exposed to blood or other infectious body fluids, are household contacts or sex partners of hepatitis B positive people, are clients or workers in certain care facilities, or travel to or work in countries with a high rate of hepatitis B should be immunized.  Haemophilus influenzae type b (Hib) vaccine. A previously unvaccinated person with asplenia or sickle cell disease or having a scheduled splenectomy should receive 1 dose of Hib vaccine. Regardless of previous immunization, a recipient of a hematopoietic stem cell  transplant should receive a 3-dose series 6-12 months after her successful transplant. Hib vaccine is not recommended for adults with HIV infection. Preventive Services / Frequency Ages 70 to 39years  Blood pressure check.** / Every 1 to 2 years.  Lipid and cholesterol check.** / Every 5 years beginning at age 42.  Clinical breast exam.** / Every 3 years for women in their 67s and 77s.  BRCA-related cancer risk assessment.** / For women who have family members with a BRCA-related cancer (breast, ovarian, tubal, or peritoneal cancers).  Pap test.** / Every 2 years from ages 65 through 52. Every 3 years starting at age 45 through age 79 or 19 with a history of 3 consecutive normal Pap tests.  HPV screening.** / Every 3 years from ages 71 through ages 39 to 22 with a history of 3 consecutive normal Pap tests.  Hepatitis C blood test.** / For any individual with known risks for hepatitis C.  Skin self-exam. / Monthly.  Influenza vaccine. / Every year.  Tetanus, diphtheria, and acellular pertussis (Tdap, Td) vaccine.** / Consult your health care provider. Pregnant women should receive 1 dose of Tdap vaccine during each pregnancy. 1 dose of Td every 10 years.  Varicella vaccine.** / Consult your health care provider. Pregnant females who do not have evidence of immunity should receive the first dose after pregnancy.  HPV vaccine. / 3 doses over 6 months, if 16 and younger. The vaccine is not recommended for use in pregnant females. However, pregnancy testing is not needed before receiving a dose.  Measles, mumps, rubella (MMR) vaccine.** / You  need at least 1 dose of MMR if you were born in 1957 or later. You may also need a 2nd dose. For females of childbearing age, rubella immunity should be determined. If there is no evidence of immunity, females who are not pregnant should be vaccinated. If there is no evidence of immunity, females who are pregnant should delay immunization until after  pregnancy.  Pneumococcal 13-valent conjugate (PCV13) vaccine.** / Consult your health care provider.  Pneumococcal polysaccharide (PPSV23) vaccine.** / 1 to 2 doses if you smoke cigarettes or if you have certain conditions.  Meningococcal vaccine.** / 1 dose if you are age 68 to 3 years and a Market researcher living in a residence hall, or have one of several medical conditions, you need to get vaccinated against meningococcal disease. You may also need additional booster doses.  Hepatitis A vaccine.** / Consult your health care provider.  Hepatitis B vaccine.** / Consult your health care provider.  Haemophilus influenzae type b (Hib) vaccine.** / Consult your health care provider. Ages 52 to 64years  Blood pressure check.** / Every 1 to 2 years.  Lipid and cholesterol check.** / Every 5 years beginning at age 32 years.  Lung cancer screening. / Every year if you are aged 48-80 years and have a 30-pack-year history of smoking and currently smoke or have quit within the past 15 years. Yearly screening is stopped once you have quit smoking for at least 15 years or develop a health problem that would prevent you from having lung cancer treatment.  Clinical breast exam.** / Every year after age 89 years.  BRCA-related cancer risk assessment.** / For women who have family members with a BRCA-related cancer (breast, ovarian, tubal, or peritoneal cancers).  Mammogram.** / Every year beginning at age 27 years and continuing for as long as you are in good health. Consult with your health care provider.  Pap test.** / Every 3 years starting at age 75 years through age 11 or 58 years with a history of 3 consecutive normal Pap tests.  HPV screening.** / Every 3 years from ages 58 years through ages 82 to 85 years with a history of 3 consecutive normal Pap tests.  Fecal occult blood test (FOBT) of stool. / Every year beginning at age 19 years and continuing until age 55 years. You may  not need to do this test if you get a colonoscopy every 10 years.  Flexible sigmoidoscopy or colonoscopy.** / Every 5 years for a flexible sigmoidoscopy or every 10 years for a colonoscopy beginning at age 46 years and continuing until age 28 years.  Hepatitis C blood test.** / For all people born from [birth year] through 1965 and any individual with known risks for hepatitis C.  Skin self-exam. / Monthly.  Influenza vaccine. / Every year.  Tetanus, diphtheria, and acellular pertussis (Tdap/Td) vaccine.** / Consult your health care provider. Pregnant women should receive 1 dose of Tdap vaccine during each pregnancy. 1 dose of Td every 10 years.  Varicella vaccine.** / Consult your health care provider. Pregnant females who do not have evidence of immunity should receive the first dose after pregnancy.  Zoster vaccine.** / 1 dose for adults aged 56 years or older.  Measles, mumps, rubella (MMR) vaccine.** / You need at least 1 dose of MMR if you were born in 1957 or later. You may also need a 2nd dose. For females of childbearing age, rubella immunity should be determined. If there is no evidence of immunity, females who  are not pregnant should be vaccinated. If there is no evidence of immunity, females who are pregnant should delay immunization until after pregnancy.  Pneumococcal 13-valent conjugate (PCV13) vaccine.** / Consult your health care provider.  Pneumococcal polysaccharide (PPSV23) vaccine.** / 1 to 2 doses if you smoke cigarettes or if you have certain conditions.  Meningococcal vaccine.** / Consult your health care provider.  Hepatitis A vaccine.** / Consult your health care provider.  Hepatitis B vaccine.** / Consult your health care provider.  Haemophilus influenzae type b (Hib) vaccine.** / Consult your health care provider. Ages 11 years and over  Blood pressure check.** / Every 1 to 2 years.  Lipid and cholesterol check.** / Every 5 years beginning at age 36 years.  Lung  cancer screening. / Every year if you are aged 73-80 years and have a 30-pack-year history of smoking and currently smoke or have quit within the past 15 years. Yearly screening is stopped once you have quit smoking for at least 15 years or develop a health problem that would prevent you from having lung cancer treatment.  Clinical breast exam.** / Every year after age 29 years.  BRCA-related cancer risk assessment.** / For women who have family members with a BRCA-related cancer (breast, ovarian, tubal, or peritoneal cancers).  Mammogram.** / Every year beginning at age 18 years and continuing for as long as you are in good health. Consult with your health care provider.  Pap test.** / Every 3 years starting at age 7 years through age 32 or 58 years with 3 consecutive normal Pap tests. Testing can be stopped between 65 and 70 years with 3 consecutive normal Pap tests and no abnormal Pap or HPV tests in the past 10 years.  HPV screening.** / Every 3 years from ages 33 years through ages 61 or 77 years with a history of 3 consecutive normal Pap tests. Testing can be stopped between 65 and 70 years with 3 consecutive normal Pap tests and no abnormal Pap or HPV tests in the past 10 years.  Fecal occult blood test (FOBT) of stool. / Every year beginning at age 27 years and continuing until age 34 years. You may not need to do this test if you get a colonoscopy every 10 years.  Flexible sigmoidoscopy or colonoscopy.** / Every 5 years for a flexible sigmoidoscopy or every 10 years for a colonoscopy beginning at age 60 years and continuing until age 59 years.  Hepatitis C blood test.** / For all people born from [birth year] through 1965 and any individual with known risks for hepatitis C.  Osteoporosis screening.** / A one-time screening for women ages 53 years and over and women at risk for fractures or osteoporosis.  Skin self-exam. / Monthly.  Influenza vaccine. / Every year.  Tetanus, diphtheria, and  acellular pertussis (Tdap/Td) vaccine.** / 1 dose of Td every 10 years.  Varicella vaccine.** / Consult your health care provider.  Zoster vaccine.** / 1 dose for adults aged 38 years or older.  Pneumococcal 13-valent conjugate (PCV13) vaccine.** / Consult your health care provider.  Pneumococcal polysaccharide (PPSV23) vaccine.** / 1 dose for all adults aged 8 years and older.  Meningococcal vaccine.** / Consult your health care provider.  Hepatitis A vaccine.** / Consult your health care provider.  Hepatitis B vaccine.** / Consult your health care provider.  Haemophilus influenzae type b (Hib) vaccine.** / Consult your health care provider. ** Family history and personal history of risk and conditions may change your health care provider's  recommendations. Document Released: 10/07/2001 Document Revised: 08/16/2013 Document Reviewed: 01/06/2011 [facility] Patient Information 2015 [place], [place]. This information is not intended to replace advice given to you by your health care provider. Make sure you discuss any questions you have with your health care provider.

## 2014-02-17 NOTE — Progress Notes (Signed)
Subjective:    Patient ID: Holly Fowler, female    DOB: 10-May-1956, 58 y.o.   MRN: [id number]  Hypertension This is a chronic problem. The current episode started more than 1 year ago. The problem has been gradually improving since onset. The problem is controlled. Pertinent negatives include no anxiety, blurred vision, chest pain, headaches, malaise/fatigue, neck pain, orthopnea, palpitations, peripheral edema, PND, shortness of breath or sweats. There are no associated agents to hypertension. Past treatments include calcium channel blockers. The current treatment provides moderate improvement. Compliance problems include exercise and diet.       Review of Systems  Constitutional: Negative.  Negative for malaise/fatigue.  HENT: Negative.   Eyes: Negative.  Negative for blurred vision.  Respiratory: Negative.  Negative for apnea, cough, choking, chest tightness, shortness of breath, wheezing and stridor.   Cardiovascular: Negative.  Negative for chest pain, palpitations, orthopnea, leg swelling and PND.  Gastrointestinal: Negative.  Negative for nausea, vomiting, abdominal pain, diarrhea, constipation and blood in stool.  Endocrine: Negative.   Genitourinary: Negative.   Musculoskeletal: Negative.  Negative for neck pain.  Skin: Negative.   Allergic/Immunologic: Negative.   Neurological: Negative.  Negative for headaches.  Hematological: Negative.  Negative for adenopathy. Does not bruise/bleed easily.  Psychiatric/Behavioral: Negative.        Objective:   Physical Exam  Vitals reviewed. Constitutional: She is oriented to person, place, and time. She appears well-developed and well-nourished. No distress.  HENT:  Head: Normocephalic and atraumatic.  Mouth/Throat: Oropharynx is clear and moist. No oropharyngeal exudate.  Eyes: Conjunctivae are normal. Right eye exhibits no discharge. Left eye exhibits no discharge. No scleral icterus.  Neck: Normal range of motion. Neck supple.  No JVD present. No tracheal deviation present. No thyromegaly present.  Cardiovascular: Normal rate, regular rhythm, normal heart sounds and intact distal pulses.  Exam reveals no gallop and no friction rub.   No murmur heard. Pulmonary/Chest: Effort normal and breath sounds normal. No stridor. No respiratory distress. She has no wheezes. She has no rales. She exhibits no tenderness.  Abdominal: Soft. Bowel sounds are normal. She exhibits no distension and no mass. There is no tenderness. There is no rebound and no guarding. Hernia confirmed negative in the right inguinal area and confirmed negative in the left inguinal area.  Genitourinary: Rectum normal, vagina normal and uterus normal. Rectal exam shows no external hemorrhoid, no internal hemorrhoid, no fissure, no mass, no tenderness and anal tone normal. Guaiac negative stool. No breast swelling, discharge or bleeding. Pelvic exam was performed with patient supine. No labial fusion. There is no rash, tenderness, lesion or injury on the right labia. There is no rash, tenderness, lesion or injury on the left labia. Uterus is not deviated, not enlarged, not fixed and not tender. Cervix exhibits no motion tenderness, no discharge and no friability. Right adnexum displays no mass, no tenderness and no fullness. Left adnexum displays no mass, no tenderness and no fullness. No erythema, tenderness or bleeding around the vagina. No foreign body around the vagina. No signs of injury around the vagina. No vaginal discharge found.  Musculoskeletal: Normal range of motion. She exhibits no edema and no tenderness.  Lymphadenopathy:    She has no cervical adenopathy.       Right: No inguinal adenopathy present.       Left: No inguinal adenopathy present.  Neurological: She is oriented to person, place, and time.  Skin: Skin is warm and dry. No rash noted. She is  not diaphoretic. No erythema. No pallor.  Psychiatric: She has a normal mood and affect. Her behavior  is normal. Judgment and thought content normal.     No results found for this basename: WBC, HGB, HCT, PLT, GLUCOSE, CHOL, TRIG, HDL, LDLDIRECT, LDLCALC, ALT, AST, NA, K, CL, CREATININE, BUN, CO2, TSH, PSA, INR, GLUF, HGBA1C, MICROALBUR       Assessment & Plan:

## 2014-02-18 NOTE — Assessment & Plan Note (Signed)
Her BP is well controlled 

## 2014-02-18 NOTE — Assessment & Plan Note (Signed)
Exam done She was referred for a colonoscopy Vaccines were updated Labs ordered Pt ed material was given

## 2014-02-21 ENCOUNTER — Encounter: Payer: Self-pay | Admitting: Internal Medicine

## 2014-02-21 LAB — CYTOLOGY - PAP

## 2014-05-18 LAB — HM COLONOSCOPY: HM COLON: NORMAL

## 2014-08-29 ENCOUNTER — Ambulatory Visit (INDEPENDENT_AMBULATORY_CARE_PROVIDER_SITE_OTHER): Payer: BC Managed Care – PPO | Admitting: *Deleted

## 2014-08-29 ENCOUNTER — Ambulatory Visit (INDEPENDENT_AMBULATORY_CARE_PROVIDER_SITE_OTHER): Payer: BC Managed Care – PPO | Admitting: Internal Medicine

## 2014-08-29 ENCOUNTER — Encounter: Payer: Self-pay | Admitting: Internal Medicine

## 2014-08-29 ENCOUNTER — Other Ambulatory Visit (INDEPENDENT_AMBULATORY_CARE_PROVIDER_SITE_OTHER): Payer: BC Managed Care – PPO

## 2014-08-29 VITALS — BP 126/76 | HR 81 | Temp 98.2°F | Resp 16 | Ht 63.5 in | Wt 220.0 lb

## 2014-08-29 DIAGNOSIS — N183 Chronic kidney disease, stage 3 unspecified: Secondary | ICD-10-CM

## 2014-08-29 DIAGNOSIS — I10 Essential (primary) hypertension: Secondary | ICD-10-CM

## 2014-08-29 DIAGNOSIS — Z23 Encounter for immunization: Secondary | ICD-10-CM

## 2014-08-29 LAB — URINALYSIS, ROUTINE W REFLEX MICROSCOPIC
Hgb urine dipstick: NEGATIVE
KETONES UR: NEGATIVE
Leukocytes, UA: NEGATIVE
Nitrite: NEGATIVE
Specific Gravity, Urine: 1.025 (ref 1.000–1.030)
Total Protein, Urine: NEGATIVE
UROBILINOGEN UA: 0.2 (ref 0.0–1.0)
Urine Glucose: NEGATIVE
pH: 6 (ref 5.0–8.0)

## 2014-08-29 LAB — CBC WITH DIFFERENTIAL/PLATELET
Basophils Absolute: 0 10*3/uL (ref 0.0–0.1)
Basophils Relative: 0.4 % (ref 0.0–3.0)
EOS PCT: 0.4 % (ref 0.0–5.0)
Eosinophils Absolute: 0 10*3/uL (ref 0.0–0.7)
HCT: 39.5 % (ref 36.0–46.0)
Hemoglobin: 12.8 g/dL (ref 12.0–15.0)
LYMPHS ABS: 2 10*3/uL (ref 0.7–4.0)
Lymphocytes Relative: 15.7 % (ref 12.0–46.0)
MCHC: 32.3 g/dL (ref 30.0–36.0)
MCV: 79.6 fl (ref 78.0–100.0)
MONO ABS: 0.7 10*3/uL (ref 0.1–1.0)
Monocytes Relative: 5.2 % (ref 3.0–12.0)
NEUTROS ABS: 10.2 10*3/uL — AB (ref 1.4–7.7)
Neutrophils Relative %: 78.3 % — ABNORMAL HIGH (ref 43.0–77.0)
PLATELETS: 376 10*3/uL (ref 150.0–400.0)
RBC: 4.97 Mil/uL (ref 3.87–5.11)
RDW: 14.4 % (ref 11.5–15.5)
WBC: 13 10*3/uL — ABNORMAL HIGH (ref 4.0–10.5)

## 2014-08-29 LAB — BASIC METABOLIC PANEL
BUN: 15 mg/dL (ref 6–23)
CALCIUM: 9.3 mg/dL (ref 8.4–10.5)
CO2: 25 mEq/L (ref 19–32)
Chloride: 106 mEq/L (ref 96–112)
Creatinine, Ser: 1.1 mg/dL (ref 0.4–1.2)
GFR: 66.21 mL/min (ref >60.00)
GLUCOSE: 108 mg/dL — AB (ref 70–99)
Potassium: 4.2 mEq/L (ref 3.5–5.1)
SODIUM: 138 meq/L (ref 135–145)

## 2014-08-29 MED ORDER — VERAPAMIL HCL ER 240 MG PO TBCR: 240.0000 mg | EXTENDED_RELEASE_TABLET | Freq: Every day | ORAL | Status: DC

## 2014-08-29 NOTE — Assessment & Plan Note (Signed)
There has been some improvement based on the labs today I have asked her to have an U/S done of her kidneys to see if there are cysts, asymmetry, small or large size, or evidence of obstruction

## 2014-08-29 NOTE — Assessment & Plan Note (Signed)
Her BP is well controlled Will recheck her lytes and renal function today

## 2014-08-29 NOTE — Progress Notes (Signed)
   Subjective:    Patient ID: Holly Fowler, female    DOB: [birth year], 59 y.o.   MRN: [id number]  Hypertension This is a chronic problem. The current episode started more than 1 year ago. The problem has been gradually improving since onset. The problem is controlled. Pertinent negatives include no anxiety, blurred vision, chest pain, headaches, malaise/fatigue, neck pain, orthopnea, palpitations, peripheral edema, PND, shortness of breath or sweats. There are no associated agents to hypertension. Past treatments include calcium channel blockers. The current treatment provides significant improvement. Compliance problems include diet and exercise.  Hypertensive end-organ damage includes kidney disease. Identifiable causes of hypertension include chronic renal disease.      Review of Systems  Constitutional: Negative.  Negative for fever, chills, malaise/fatigue, diaphoresis, appetite change and fatigue.  HENT: Negative.   Eyes: Negative.  Negative for blurred vision.  Respiratory: Negative.  Negative for apnea, cough, choking, chest tightness, shortness of breath, wheezing and stridor.   Cardiovascular: Negative.  Negative for chest pain, palpitations, orthopnea, leg swelling and PND.  Gastrointestinal: Negative.  Negative for nausea, abdominal pain, diarrhea and constipation.  Endocrine: Negative.   Genitourinary: Negative.  Negative for dysuria, urgency, hematuria, flank pain and difficulty urinating.  Musculoskeletal: Negative.  Negative for neck pain.  Skin: Negative.   Allergic/Immunologic: Negative.   Neurological: Negative.  Negative for dizziness and headaches.  Hematological: Negative.  Negative for adenopathy. Does not bruise/bleed easily.  Psychiatric/Behavioral: Negative.        Objective:   Physical Exam  Constitutional: She is oriented to person, place, and time. She appears well-developed and well-nourished. No distress.  HENT:  Head: Normocephalic and atraumatic.    Mouth/Throat: Oropharynx is clear and moist. No oropharyngeal exudate.  Eyes: Conjunctivae are normal. Right eye exhibits no discharge. Left eye exhibits no discharge. No scleral icterus.  Neck: Normal range of motion. Neck supple. No JVD present. No tracheal deviation present. No thyromegaly present.  Cardiovascular: Normal rate, regular rhythm, normal heart sounds and intact distal pulses.  Exam reveals no gallop and no friction rub.   No murmur heard. Pulmonary/Chest: Effort normal and breath sounds normal. No stridor. No respiratory distress. She has no wheezes. She has no rales. She exhibits no tenderness.  Abdominal: Soft. Bowel sounds are normal. She exhibits no distension and no mass. There is no tenderness. There is no rebound and no guarding.  Musculoskeletal: Normal range of motion. She exhibits no edema or tenderness.  Lymphadenopathy:    She has no cervical adenopathy.  Neurological: She is oriented to person, place, and time.  Skin: Skin is warm and dry. No rash noted. She is not diaphoretic. No erythema. No pallor.  Psychiatric: She has a normal mood and affect. Her behavior is normal. Judgment and thought content normal.  Vitals reviewed.     Lab Results  Component Value Date   WBC 13.4* 02/17/2014   HGB 12.7 02/17/2014   HCT 39.0 02/17/2014   PLT 364.0 02/17/2014   GLUCOSE 106* 02/17/2014   CHOL 212* 02/17/2014   TRIG 85.0 02/17/2014   HDL 44.50 02/17/2014   LDLCALC 151* 02/17/2014   ALT 18 02/17/2014   AST 20 02/17/2014   NA 138 02/17/2014   K 4.1 02/17/2014   CL 104 02/17/2014   CREATININE 1.3* 02/17/2014   BUN 14 02/17/2014   CO2 26 02/17/2014   TSH 1.73 02/17/2014      Assessment & Plan:

## 2014-08-29 NOTE — Patient Instructions (Signed)

## 2015-01-19 ENCOUNTER — Ambulatory Visit: Payer: BC Managed Care – PPO | Admitting: Internal Medicine

## 2015-02-19 ENCOUNTER — Other Ambulatory Visit (INDEPENDENT_AMBULATORY_CARE_PROVIDER_SITE_OTHER): Payer: BC Managed Care – PPO

## 2015-02-19 ENCOUNTER — Ambulatory Visit (INDEPENDENT_AMBULATORY_CARE_PROVIDER_SITE_OTHER): Payer: BC Managed Care – PPO | Admitting: Internal Medicine

## 2015-02-19 ENCOUNTER — Encounter: Payer: Self-pay | Admitting: Internal Medicine

## 2015-02-19 VITALS — BP 152/88 | HR 82 | Temp 97.6°F | Resp 16 | Wt 222.1 lb

## 2015-02-19 DIAGNOSIS — E785 Hyperlipidemia, unspecified: Secondary | ICD-10-CM | POA: Diagnosis not present

## 2015-02-19 DIAGNOSIS — N183 Chronic kidney disease, stage 3 unspecified: Secondary | ICD-10-CM

## 2015-02-19 DIAGNOSIS — I10 Essential (primary) hypertension: Secondary | ICD-10-CM

## 2015-02-19 LAB — COMPREHENSIVE METABOLIC PANEL
ALT: 14 U/L (ref 0–35)
AST: 14 U/L (ref 0–37)
Albumin: 4 g/dL (ref 3.5–5.2)
Alkaline Phosphatase: 131 U/L — ABNORMAL HIGH (ref 39–117)
BUN: 11 mg/dL (ref 6–23)
CALCIUM: 9.4 mg/dL (ref 8.4–10.5)
CHLORIDE: 103 meq/L (ref 96–112)
CO2: 29 meq/L (ref 19–32)
CREATININE: 1.06 mg/dL (ref 0.40–1.20)
GFR: 68.26 mL/min (ref >60.00)
Glucose, Bld: 91 mg/dL (ref 70–99)
Potassium: 3.8 mEq/L (ref 3.5–5.1)
Sodium: 136 mEq/L (ref 135–145)
Total Bilirubin: 0.4 mg/dL (ref 0.2–1.2)
Total Protein: 7.3 g/dL (ref 6.0–8.3)

## 2015-02-19 LAB — LIPID PANEL
CHOLESTEROL: 201 mg/dL — AB (ref 0–200)
HDL: 43.4 mg/dL (ref >39.00)
LDL CALC: 140 mg/dL — AB (ref 0–99)
NonHDL: 157.6
TRIGLYCERIDES: 90 mg/dL (ref 0.0–149.0)
Total CHOL/HDL Ratio: 5
VLDL: 18 mg/dL (ref 0.0–40.0)

## 2015-02-19 LAB — CBC WITH DIFFERENTIAL/PLATELET
BASOS PCT: 0.3 % (ref 0.0–3.0)
Basophils Absolute: 0 10*3/uL (ref 0.0–0.1)
Eosinophils Absolute: 0.1 10*3/uL (ref 0.0–0.7)
Eosinophils Relative: 0.5 % (ref 0.0–5.0)
HEMATOCRIT: 40 % (ref 36.0–46.0)
HEMOGLOBIN: 12.8 g/dL (ref 12.0–15.0)
LYMPHS ABS: 3.5 10*3/uL (ref 0.7–4.0)
LYMPHS PCT: 27.9 % (ref 12.0–46.0)
MCHC: 32 g/dL (ref 30.0–36.0)
MCV: 80.7 fl (ref 78.0–100.0)
Monocytes Absolute: 0.7 10*3/uL (ref 0.1–1.0)
Monocytes Relative: 5.4 % (ref 3.0–12.0)
NEUTROS ABS: 8.2 10*3/uL — AB (ref 1.4–7.7)
Neutrophils Relative %: 65.9 % (ref 43.0–77.0)
Platelets: 354 10*3/uL (ref 150.0–400.0)
RBC: 4.96 Mil/uL (ref 3.87–5.11)
RDW: 14.1 % (ref 11.5–15.5)
WBC: 12.5 10*3/uL — ABNORMAL HIGH (ref 4.0–10.5)

## 2015-02-19 LAB — TSH: TSH: 1.37 u[IU]/mL (ref 0.35–4.50)

## 2015-02-19 MED ORDER — VERAPAMIL HCL ER 240 MG PO TBCR: 240.0000 mg | EXTENDED_RELEASE_TABLET | Freq: Every day | ORAL | Status: DC

## 2015-02-19 MED ORDER — CHLORTHALIDONE 25 MG PO TABS: 25.0000 mg | ORAL_TABLET | Freq: Every day | ORAL | Status: DC

## 2015-02-19 NOTE — Patient Instructions (Signed)

## 2015-02-19 NOTE — Progress Notes (Signed)
Subjective:  Patient ID: Holly Fowler, female    DOB: Dec 28, 1955  Age: 59 y.o. MRN: [id number]  CC: Follow-up; Hypertension; and Hyperlipidemia   HPI JERAE IZARD presents for a BP check - her BP has not been well controlled, she has not been compliant with lifestyle modifications.  Outpatient Prescriptions Prior to Visit  Medication Sig Dispense Refill  . triamcinolone cream (KENALOG) 0.5 % Apply topically 2 (two) times daily as needed. Use sparingly as this medication can bleach your skin. 30 g 0  . verapamil (CALAN-SR) 240 MG CR tablet Take 1 tablet (240 mg total) by mouth at bedtime. 30 tablet 5   No facility-administered medications prior to visit.    ROS Review of Systems  Constitutional: Negative.  Negative for fever, chills, diaphoresis, appetite change and fatigue.  HENT: Negative.  Negative for trouble swallowing and voice change.   Eyes: Negative.   Respiratory: Negative.  Negative for cough, choking, chest tightness, shortness of breath and stridor.   Cardiovascular: Negative.  Negative for chest pain, palpitations and leg swelling.  Gastrointestinal: Negative.  Negative for nausea, vomiting, abdominal pain, diarrhea, constipation and blood in stool.  Endocrine: Negative.   Genitourinary: Negative.   Musculoskeletal: Negative.  Negative for myalgias, back pain, joint swelling, arthralgias and neck pain.  Skin: Negative.  Negative for rash.  Allergic/Immunologic: Negative.   Neurological: Negative.  Negative for dizziness, tremors, weakness, light-headedness, numbness and headaches.  Hematological: Negative.  Negative for adenopathy. Does not bruise/bleed easily.  Psychiatric/Behavioral: Negative.  Negative for suicidal ideas, decreased concentration and agitation. The patient is not nervous/anxious and is not hyperactive.     Objective:  BP 152/88 mmHg  Pulse 82  Temp(Src) 97.6 F (36.4 C) (Oral)  Resp 16  Wt 222 lb 1.3 oz (100.735 kg)  SpO2 98%  BP  Readings from Last 3 Encounters:  02/19/15 152/88  08/29/14 126/76  02/17/14 118/74    Wt Readings from Last 3 Encounters:  02/19/15 222 lb 1.3 oz (100.735 kg)  08/29/14 220 lb (99.791 kg)  02/17/14 221 lb (100.245 kg)    Physical Exam  Constitutional: She is oriented to person, place, and time. She appears well-developed and well-nourished. No distress.  HENT:  Head: Normocephalic and atraumatic.  Mouth/Throat: Oropharynx is clear and moist. No oropharyngeal exudate.  Eyes: Conjunctivae are normal. Right eye exhibits no discharge. Left eye exhibits no discharge. No scleral icterus.  Neck: Normal range of motion. Neck supple. No JVD present. No tracheal deviation present. No thyromegaly present.  Cardiovascular: Normal rate, regular rhythm, normal heart sounds and intact distal pulses.  Exam reveals no gallop and no friction rub.   No murmur heard. Pulmonary/Chest: Effort normal and breath sounds normal. No stridor. No respiratory distress. She has no wheezes. She has no rales. She exhibits no tenderness.  Abdominal: Soft. Bowel sounds are normal. She exhibits no distension and no mass. There is no tenderness. There is no rebound and no guarding.  Musculoskeletal: Normal range of motion. She exhibits no edema or tenderness.  Lymphadenopathy:    She has no cervical adenopathy.  Neurological: She is oriented to person, place, and time.  Skin: Skin is warm and dry. No rash noted. She is not diaphoretic. No erythema. No pallor.  Psychiatric: She has a normal mood and affect. Her behavior is normal. Judgment and thought content normal.  Vitals reviewed.   Lab Results  Component Value Date   WBC 13.0* 08/29/2014   HGB 12.8 08/29/2014  HCT 39.5 08/29/2014   PLT 376.0 08/29/2014   GLUCOSE 108* 08/29/2014   CHOL 212* 02/17/2014   TRIG 85.0 02/17/2014   HDL 44.50 02/17/2014   LDLCALC 151* 02/17/2014   ALT 18 02/17/2014   AST 20 02/17/2014   NA 138 08/29/2014   K 4.2 08/29/2014    CL 106 08/29/2014   CREATININE 1.1 08/29/2014   BUN 15 08/29/2014   CO2 25 08/29/2014   TSH 1.73 02/17/2014    No results found.  Assessment & Plan:   Raiyah was seen today for follow-up, hypertension and hyperlipidemia.  Diagnoses and all orders for this visit:  Essential hypertension - her BP is not well controlled, will add a thiazide diuretic to the CCB, I have asked her to be more compliant with her lifestyle modifications Orders: -     verapamil (CALAN-SR) 240 MG CR tablet; Take 1 tablet (240 mg total) by mouth at bedtime. -     chlorthalidone (HYGROTON) 25 MG tablet; Take 1 tablet (25 mg total) by mouth daily. -     Lipid panel; Future -     Comprehensive metabolic panel; Future -     CBC with Differential/Platelet; Future -     TSH; Future  Kidney disease, chronic, stage III (GFR 30-59 ml/min) - will monitor her renal function and will keep good control of the BP Orders: -     chlorthalidone (HYGROTON) 25 MG tablet; Take 1 tablet (25 mg total) by mouth daily.  I am having Ms. Viana start on chlorthalidone. I am also having her maintain her triamcinolone cream and verapamil.  Meds ordered this encounter  Medications  . verapamil (CALAN-SR) 240 MG CR tablet    Sig: Take 1 tablet (240 mg total) by mouth at bedtime.    Dispense:  30 tablet    Refill:  5  . chlorthalidone (HYGROTON) 25 MG tablet    Sig: Take 1 tablet (25 mg total) by mouth daily.    Dispense:  30 tablet    Refill:  5     Follow-up: Return in about 4 months (around 06/21/2015).  [name], MD

## 2015-02-20 ENCOUNTER — Encounter: Payer: Self-pay | Admitting: Internal Medicine

## 2015-02-20 DIAGNOSIS — E785 Hyperlipidemia, unspecified: Secondary | ICD-10-CM | POA: Insufficient documentation

## 2015-02-20 MED ORDER — ATORVASTATIN CALCIUM 40 MG PO TABS: 40.0000 mg | ORAL_TABLET | Freq: Every day | ORAL | Status: DC

## 2015-02-20 NOTE — Assessment & Plan Note (Signed)
She has a moderately elevated Framingham risk score so I will ask her to start a statin

## 2015-02-20 NOTE — Addendum Note (Signed)
Addended by: Janith Lima on: 02/20/2015 07:39 AM   Modules accepted: Orders

## 2015-02-22 ENCOUNTER — Telehealth: Payer: Self-pay | Admitting: Internal Medicine

## 2015-02-22 ENCOUNTER — Other Ambulatory Visit: Payer: Self-pay | Admitting: Internal Medicine

## 2015-02-22 DIAGNOSIS — L309 Dermatitis, unspecified: Secondary | ICD-10-CM

## 2015-02-22 MED ORDER — TRIAMCINOLONE ACETONIDE 0.5 % EX CREA: TOPICAL_CREAM | Freq: Two times a day (BID) | CUTANEOUS | Status: DC | PRN

## 2015-02-22 NOTE — Telephone Encounter (Signed)
Receive refill request from [facility] , requesting refill for Triamcinolone Aceton 0.1% topical cream GM.  Rx last written 07/15/2013 and patient last seen 02/19/2015     .

## 2015-02-28 ENCOUNTER — Telehealth: Payer: Self-pay | Admitting: Internal Medicine

## 2015-03-02 NOTE — Telephone Encounter (Signed)
error 

## 2015-08-21 ENCOUNTER — Encounter: Payer: Self-pay | Admitting: Internal Medicine

## 2015-08-21 ENCOUNTER — Ambulatory Visit: Payer: BC Managed Care – PPO | Admitting: Internal Medicine

## 2015-08-21 ENCOUNTER — Other Ambulatory Visit (INDEPENDENT_AMBULATORY_CARE_PROVIDER_SITE_OTHER): Payer: BC Managed Care – PPO

## 2015-08-21 ENCOUNTER — Ambulatory Visit (INDEPENDENT_AMBULATORY_CARE_PROVIDER_SITE_OTHER): Payer: BC Managed Care – PPO | Admitting: Internal Medicine

## 2015-08-21 VITALS — BP 130/80 | HR 86 | Temp 97.9°F | Resp 16 | Ht 63.0 in | Wt 218.0 lb

## 2015-08-21 DIAGNOSIS — I10 Essential (primary) hypertension: Secondary | ICD-10-CM | POA: Diagnosis not present

## 2015-08-21 DIAGNOSIS — N183 Chronic kidney disease, stage 3 unspecified: Secondary | ICD-10-CM

## 2015-08-21 DIAGNOSIS — D72829 Elevated white blood cell count, unspecified: Secondary | ICD-10-CM | POA: Diagnosis not present

## 2015-08-21 DIAGNOSIS — L309 Dermatitis, unspecified: Secondary | ICD-10-CM | POA: Diagnosis not present

## 2015-08-21 DIAGNOSIS — Z23 Encounter for immunization: Secondary | ICD-10-CM | POA: Diagnosis not present

## 2015-08-21 LAB — CBC WITH DIFFERENTIAL/PLATELET
Basophils Absolute: 0.1 10*3/uL (ref 0.0–0.1)
Basophils Relative: 0.4 % (ref 0.0–3.0)
EOS ABS: 0.1 10*3/uL (ref 0.0–0.7)
Eosinophils Relative: 0.7 % (ref 0.0–5.0)
HCT: 41.7 % (ref 36.0–46.0)
HEMOGLOBIN: 13.4 g/dL (ref 12.0–15.0)
Lymphocytes Relative: 17.4 % (ref 12.0–46.0)
Lymphs Abs: 2.5 10*3/uL (ref 0.7–4.0)
MCHC: 32.1 g/dL (ref 30.0–36.0)
MCV: 81.9 fl (ref 78.0–100.0)
MONO ABS: 0.7 10*3/uL (ref 0.1–1.0)
Monocytes Relative: 5.2 % (ref 3.0–12.0)
Neutro Abs: 10.9 10*3/uL — ABNORMAL HIGH (ref 1.4–7.7)
Neutrophils Relative %: 76.3 % (ref 43.0–77.0)
Platelets: 379 10*3/uL (ref 150.0–400.0)
RBC: 5.09 Mil/uL (ref 3.87–5.11)
RDW: 13.7 % (ref 11.5–15.5)
WBC: 14.4 10*3/uL — AB (ref 4.0–10.5)

## 2015-08-21 LAB — BASIC METABOLIC PANEL
BUN: 19 mg/dL (ref 6–23)
CALCIUM: 9.4 mg/dL (ref 8.4–10.5)
CO2: 25 meq/L (ref 19–32)
CREATININE: 1.2 mg/dL (ref 0.40–1.20)
Chloride: 104 mEq/L (ref 96–112)
GFR: 59.06 mL/min — AB (ref >60.00)
Glucose, Bld: 115 mg/dL — ABNORMAL HIGH (ref 70–99)
Potassium: 4.1 mEq/L (ref 3.5–5.1)
Sodium: 138 mEq/L (ref 135–145)

## 2015-08-21 MED ORDER — VERAPAMIL HCL ER 240 MG PO TBCR: 240.0000 mg | EXTENDED_RELEASE_TABLET | Freq: Every day | ORAL | Status: DC

## 2015-08-21 MED ORDER — CHLORTHALIDONE 25 MG PO TABS: 25.0000 mg | ORAL_TABLET | Freq: Every day | ORAL | Status: DC

## 2015-08-21 MED ORDER — TRIAMCINOLONE ACETONIDE 0.5 % EX CREA: TOPICAL_CREAM | Freq: Two times a day (BID) | CUTANEOUS | Status: DC | PRN

## 2015-08-21 NOTE — Progress Notes (Signed)
Subjective:  Patient ID: Holly Fowler, female    DOB: [birth year]  Age: 59 y.o. MRN: [id number]  CC: Hypertension and Hyperlipidemia   HPI Holly Fowler presents for f/up - she has decided not to take a cholesterol medication. She feels well and offers no complaints.  Outpatient Prescriptions Prior to Visit  Medication Sig Dispense Refill  . atorvastatin (LIPITOR) 40 MG tablet Take 1 tablet (40 mg total) by mouth daily. 90 tablet 3  . chlorthalidone (HYGROTON) 25 MG tablet Take 1 tablet (25 mg total) by mouth daily. 30 tablet 5  . triamcinolone cream (KENALOG) 0.5 % Apply topically 2 (two) times daily as needed. Use sparingly as this medication can bleach your skin. 30 g 2  . verapamil (CALAN-SR) 240 MG CR tablet Take 1 tablet (240 mg total) by mouth at bedtime. 30 tablet 5   No facility-administered medications prior to visit.    ROS Review of Systems  Constitutional: Negative.  Negative for fever, chills, diaphoresis, activity change, appetite change, fatigue and unexpected weight change.  HENT: Negative.  Negative for sinus pressure, sore throat, trouble swallowing and voice change.   Eyes: Negative.   Respiratory: Negative.  Negative for cough, choking, chest tightness, shortness of breath and stridor.   Cardiovascular: Negative.  Negative for chest pain, palpitations and leg swelling.  Gastrointestinal: Negative.  Negative for nausea, vomiting, abdominal pain, diarrhea, constipation and blood in stool.  Endocrine: Negative.   Genitourinary: Negative.  Negative for difficulty urinating.  Musculoskeletal: Negative.  Negative for myalgias, back pain, joint swelling and arthralgias.  Skin: Negative.  Negative for color change.  Allergic/Immunologic: Negative.   Neurological: Negative.   Hematological: Negative.  Negative for adenopathy. Does not bruise/bleed easily.  Psychiatric/Behavioral: Negative.     Objective:  BP 130/80 mmHg  Pulse 86  Temp(Src) 97.9 F (36.6 C)  (Oral)  Resp 16  Ht 5\' 3"  (1.6 m)  Wt 218 lb (98.884 kg)  BMI 38.63 kg/m2  SpO2 95%  BP Readings from Last 3 Encounters:  08/21/15 130/80  02/19/15 152/88  08/29/14 126/76    Wt Readings from Last 3 Encounters:  08/21/15 218 lb (98.884 kg)  02/19/15 222 lb 1.3 oz (100.735 kg)  08/29/14 220 lb (99.791 kg)    Physical Exam  Constitutional: She is oriented to person, place, and time. No distress.  HENT:  Mouth/Throat: Oropharynx is clear and moist. No oropharyngeal exudate.  Eyes: Conjunctivae are normal. Right eye exhibits no discharge. Left eye exhibits no discharge. No scleral icterus.  Neck: Normal range of motion. Neck supple. No JVD present. No tracheal deviation present. No thyromegaly present.  Cardiovascular: Normal rate, regular rhythm, normal heart sounds and intact distal pulses.  Exam reveals no gallop and no friction rub.   No murmur heard. Pulmonary/Chest: Effort normal and breath sounds normal. No stridor. No respiratory distress. She has no wheezes. She has no rales. She exhibits no tenderness.  Abdominal: Soft. Bowel sounds are normal. She exhibits no distension and no mass. There is no tenderness. There is no rebound and no guarding.  Musculoskeletal: Normal range of motion. She exhibits no edema or tenderness.  Lymphadenopathy:    She has no cervical adenopathy.  Neurological: She is oriented to person, place, and time.  Skin: Skin is warm and dry. No rash noted. She is not diaphoretic. No erythema. No pallor.  Vitals reviewed.   Lab Results  Component Value Date   WBC 14.4* 08/21/2015   HGB 13.4 08/21/2015  HCT 41.7 08/21/2015   PLT 379.0 08/21/2015   GLUCOSE 115* 08/21/2015   CHOL 201* 02/19/2015   TRIG 90.0 02/19/2015   HDL 43.40 02/19/2015   LDLCALC 140* 02/19/2015   ALT 14 02/19/2015   AST 14 02/19/2015   NA 138 08/21/2015   K 4.1 08/21/2015   CL 104 08/21/2015   CREATININE 1.20 08/21/2015   BUN 19 08/21/2015   CO2 25 08/21/2015   TSH  1.37 02/19/2015    No results found.  Assessment & Plan:   Holly Fowler was seen today for hypertension and hyperlipidemia.  Diagnoses and all orders for this visit:  Kidney disease, chronic, stage III (GFR 30-59 ml/min)- her renal function is stable, she will continue to avoid nephrotoxic agents, she was asked to quit smoking, I will continue to control her blood pressure and other risk factors for progressive kidney disease. -     Basic metabolic panel; Future -     CBC with Differential/Platelet; Future -     Discontinue: chlorthalidone (HYGROTON) 25 MG tablet; Take 1 tablet (25 mg total) by mouth daily. -     verapamil (CALAN-SR) 240 MG CR tablet; Take 1 tablet (240 mg total) by mouth at bedtime. -     chlorthalidone (HYGROTON) 25 MG tablet; Take 1 tablet (25 mg total) by mouth daily.  Essential hypertension- her blood pressure is adequately well controlled, electrolytes and renal function are stable. -     Basic metabolic panel; Future -     CBC with Differential/Platelet; Future -     Discontinue: chlorthalidone (HYGROTON) 25 MG tablet; Take 1 tablet (25 mg total) by mouth daily. -     Discontinue: verapamil (CALAN-SR) 240 MG CR tablet; Take 1 tablet (240 mg total) by mouth at bedtime. -     verapamil (CALAN-SR) 240 MG CR tablet; Take 1 tablet (240 mg total) by mouth at bedtime. -     chlorthalidone (HYGROTON) 25 MG tablet; Take 1 tablet (25 mg total) by mouth daily.  Encounter for immunization  Eczema -     triamcinolone cream (KENALOG) 0.5 %; Apply topically 2 (two) times daily as needed. Use sparingly as this medication can bleach your skin.  Leukocytosis- elevation in her white blood cell count has been stable for more than a year, the differential is unremarkable, her other cell lines are normal, this is most likely related to tobacco abuse. She was asked to quit smoking.  Other orders -     Flu Vaccine QUAD 36+ mos IM  I have discontinued Holly Fowler's atorvastatin. I am also  having her maintain her triamcinolone cream, verapamil, and chlorthalidone.  Meds ordered this encounter  Medications  . DISCONTD: chlorthalidone (HYGROTON) 25 MG tablet    Sig: Take 1 tablet (25 mg total) by mouth daily.    Dispense:  90 tablet    Refill:  2  . DISCONTD: verapamil (CALAN-SR) 240 MG CR tablet    Sig: Take 1 tablet (240 mg total) by mouth at bedtime.    Dispense:  90 tablet    Refill:  2  . triamcinolone cream (KENALOG) 0.5 %    Sig: Apply topically 2 (two) times daily as needed. Use sparingly as this medication can bleach your skin.    Dispense:  30 g    Refill:  3  . verapamil (CALAN-SR) 240 MG CR tablet    Sig: Take 1 tablet (240 mg total) by mouth at bedtime.    Dispense:  90 tablet  Refill:  2  . chlorthalidone (HYGROTON) 25 MG tablet    Sig: Take 1 tablet (25 mg total) by mouth daily.    Dispense:  90 tablet    Refill:  2     Follow-up: Return in about 6 months (around 02/19/2016).  [name], MD

## 2015-08-21 NOTE — Patient Instructions (Signed)
Hypertension Hypertension, commonly called high blood pressure, is when the force of blood pumping through your arteries is too strong. Your arteries are the blood vessels that carry blood from your heart throughout your body. A blood pressure reading consists of a higher number over a lower number, such as 110/72. The higher number (systolic) is the pressure inside your arteries when your heart pumps. The lower number (diastolic) is the pressure inside your arteries when your heart relaxes. Ideally you want your blood pressure below 120/80. Hypertension forces your heart to work harder to pump blood. Your arteries may become narrow or stiff. Having untreated or uncontrolled hypertension can cause heart attack, stroke, kidney disease, and other problems. RISK FACTORS Some risk factors for high blood pressure are controllable. Others are not.  Risk factors you cannot control include:   Race. You may be at higher risk if you are African American.  Age. Risk increases with age.  Gender. Men are at higher risk than women before age 45 years. After age 65, women are at higher risk than men. Risk factors you can control include:  Not getting enough exercise or physical activity.  Being overweight.  Getting too much fat, sugar, calories, or salt in your diet.  Drinking too much alcohol. SIGNS AND SYMPTOMS Hypertension does not usually cause signs or symptoms. Extremely high blood pressure (hypertensive crisis) may cause headache, anxiety, shortness of breath, and nosebleed. DIAGNOSIS To check if you have hypertension, your health care provider will measure your blood pressure while you are seated, with your arm held at the level of your heart. It should be measured at least twice using the same arm. Certain conditions can cause a difference in blood pressure between your right and left arms. A blood pressure reading that is higher than normal on one occasion does not mean that you need treatment. If  it is not clear whether you have high blood pressure, you may be asked to return on a different day to have your blood pressure checked again. Or, you may be asked to monitor your blood pressure at home for 1 or more weeks. TREATMENT Treating high blood pressure includes making lifestyle changes and possibly taking medicine. Living a healthy lifestyle can help lower high blood pressure. You may need to change some of your habits. Lifestyle changes may include:  Following the DASH diet. This diet is high in fruits, vegetables, and whole grains. It is low in salt, red meat, and added sugars.  Keep your sodium intake below 2,300 mg per day.  Getting at least 30-45 minutes of aerobic exercise at least 4 times per week.  Losing weight if necessary.  Not smoking.  Limiting alcoholic beverages.  Learning ways to reduce stress. Your health care provider may prescribe medicine if lifestyle changes are not enough to get your blood pressure under control, and if one of the following is true:  You are 18-59 years of age and your systolic blood pressure is above 140.  You are 60 years of age or older, and your systolic blood pressure is above 150.  Your diastolic blood pressure is above 90.  You have diabetes, and your systolic blood pressure is over 140 or your diastolic blood pressure is over 90.  You have kidney disease and your blood pressure is above 140/90.  You have heart disease and your blood pressure is above 140/90. Your personal target blood pressure may vary depending on your medical conditions, your age, and other factors. HOME CARE INSTRUCTIONS    Have your blood pressure rechecked as directed by your health care provider.   Take medicines only as directed by your health care provider. Follow the directions carefully. Blood pressure medicines must be taken as prescribed. The medicine does not work as well when you skip doses. Skipping doses also puts you at risk for  problems.  Do not smoke.   Monitor your blood pressure at home as directed by your health care provider. SEEK MEDICAL CARE IF:   You think you are having a reaction to medicines taken.  You have recurrent headaches or feel dizzy.  You have swelling in your ankles.  You have trouble with your vision. SEEK IMMEDIATE MEDICAL CARE IF:  You develop a severe headache or confusion.  You have unusual weakness, numbness, or feel faint.  You have severe chest or abdominal pain.  You vomit repeatedly.  You have trouble breathing. MAKE SURE YOU:   Understand these instructions.  Will watch your condition.  Will get help right away if you are not doing well or get worse.   This information is not intended to replace advice given to you by your health care provider. Make sure you discuss any questions you have with your health care provider.   Document Released: 08/11/2005 Document Revised: 12/26/2014 Document Reviewed: 06/03/2013 [employer] Interactive Patient Education 2016 [employer]  

## 2015-08-21 NOTE — Progress Notes (Signed)
Pre visit review using our clinic review tool, if applicable. No additional management support is needed unless otherwise documented below in the visit note. 

## 2015-08-22 ENCOUNTER — Encounter: Payer: Self-pay | Admitting: Internal Medicine

## 2016-02-19 ENCOUNTER — Ambulatory Visit: Payer: BC Managed Care – PPO | Admitting: Internal Medicine

## 2016-03-13 ENCOUNTER — Encounter: Payer: Self-pay | Admitting: Internal Medicine

## 2016-03-13 ENCOUNTER — Other Ambulatory Visit (INDEPENDENT_AMBULATORY_CARE_PROVIDER_SITE_OTHER): Payer: BC Managed Care – PPO

## 2016-03-13 ENCOUNTER — Ambulatory Visit (INDEPENDENT_AMBULATORY_CARE_PROVIDER_SITE_OTHER): Payer: BC Managed Care – PPO | Admitting: Internal Medicine

## 2016-03-13 VITALS — BP 118/74 | HR 85 | Temp 98.2°F | Resp 16 | Ht 63.0 in | Wt 219.4 lb

## 2016-03-13 DIAGNOSIS — D72829 Elevated white blood cell count, unspecified: Secondary | ICD-10-CM | POA: Diagnosis not present

## 2016-03-13 DIAGNOSIS — I10 Essential (primary) hypertension: Secondary | ICD-10-CM | POA: Diagnosis not present

## 2016-03-13 DIAGNOSIS — Z0000 Encounter for general adult medical examination without abnormal findings: Secondary | ICD-10-CM | POA: Diagnosis not present

## 2016-03-13 DIAGNOSIS — F17200 Nicotine dependence, unspecified, uncomplicated: Secondary | ICD-10-CM

## 2016-03-13 DIAGNOSIS — Z23 Encounter for immunization: Secondary | ICD-10-CM

## 2016-03-13 DIAGNOSIS — R739 Hyperglycemia, unspecified: Secondary | ICD-10-CM

## 2016-03-13 DIAGNOSIS — Z720 Tobacco use: Secondary | ICD-10-CM | POA: Insufficient documentation

## 2016-03-13 DIAGNOSIS — E785 Hyperlipidemia, unspecified: Secondary | ICD-10-CM

## 2016-03-13 DIAGNOSIS — N183 Chronic kidney disease, stage 3 unspecified: Secondary | ICD-10-CM

## 2016-03-13 DIAGNOSIS — Z1231 Encounter for screening mammogram for malignant neoplasm of breast: Secondary | ICD-10-CM | POA: Insufficient documentation

## 2016-03-13 LAB — LIPID PANEL
CHOL/HDL RATIO: 5
Cholesterol: 200 mg/dL (ref 0–200)
HDL: 44.5 mg/dL (ref >39.00)
LDL CALC: 139 mg/dL — AB (ref 0–99)
NONHDL: 155.99
Triglycerides: 86 mg/dL (ref 0.0–149.0)
VLDL: 17.2 mg/dL (ref 0.0–40.0)

## 2016-03-13 LAB — CBC WITH DIFFERENTIAL/PLATELET
BASOS PCT: 0.4 % (ref 0.0–3.0)
Basophils Absolute: 0.1 10*3/uL (ref 0.0–0.1)
EOS ABS: 0.1 10*3/uL (ref 0.0–0.7)
Eosinophils Relative: 0.6 % (ref 0.0–5.0)
HEMATOCRIT: 40.2 % (ref 36.0–46.0)
Hemoglobin: 13 g/dL (ref 12.0–15.0)
LYMPHS ABS: 2.4 10*3/uL (ref 0.7–4.0)
Lymphocytes Relative: 16.6 % (ref 12.0–46.0)
MCHC: 32.4 g/dL (ref 30.0–36.0)
MCV: 81 fl (ref 78.0–100.0)
MONOS PCT: 5.7 % (ref 3.0–12.0)
Monocytes Absolute: 0.8 10*3/uL (ref 0.1–1.0)
NEUTROS ABS: 11.2 10*3/uL — AB (ref 1.4–7.7)
Neutrophils Relative %: 76.7 % (ref 43.0–77.0)
PLATELETS: 402 10*3/uL — AB (ref 150.0–400.0)
RBC: 4.96 Mil/uL (ref 3.87–5.11)
RDW: 14.2 % (ref 11.5–15.5)
WBC: 14.6 10*3/uL — AB (ref 4.0–10.5)

## 2016-03-13 LAB — COMPREHENSIVE METABOLIC PANEL
ALBUMIN: 4.2 g/dL (ref 3.5–5.2)
ALT: 14 U/L (ref 0–35)
AST: 15 U/L (ref 0–37)
Alkaline Phosphatase: 132 U/L — ABNORMAL HIGH (ref 39–117)
BUN: 20 mg/dL (ref 6–23)
CHLORIDE: 103 meq/L (ref 96–112)
CO2: 27 mEq/L (ref 19–32)
Calcium: 9.9 mg/dL (ref 8.4–10.5)
Creatinine, Ser: 1.3 mg/dL — ABNORMAL HIGH (ref 0.40–1.20)
GFR: 53.74 mL/min — ABNORMAL LOW (ref >60.00)
GLUCOSE: 102 mg/dL — AB (ref 70–99)
POTASSIUM: 4.4 meq/L (ref 3.5–5.1)
SODIUM: 138 meq/L (ref 135–145)
Total Bilirubin: 0.4 mg/dL (ref 0.2–1.2)
Total Protein: 7.8 g/dL (ref 6.0–8.3)

## 2016-03-13 LAB — HEMOGLOBIN A1C: HEMOGLOBIN A1C: 5.3 % (ref 4.6–6.5)

## 2016-03-13 LAB — TSH: TSH: 1.52 u[IU]/mL (ref 0.35–4.50)

## 2016-03-13 MED ORDER — VERAPAMIL HCL ER 240 MG PO TBCR: 240.0000 mg | EXTENDED_RELEASE_TABLET | Freq: Every day | ORAL | Status: DC

## 2016-03-13 MED ORDER — CHLORTHALIDONE 25 MG PO TABS: 25.0000 mg | ORAL_TABLET | Freq: Every day | ORAL | Status: DC

## 2016-03-13 NOTE — Patient Instructions (Signed)
Preventive Care for Adults, Female A healthy lifestyle and preventive care can promote health and wellness. Preventive health guidelines for women include the following key practices.  A routine yearly physical is a good way to check with your health care provider about your health and preventive screening. It is a chance to share any concerns and updates on your health and to receive a thorough exam.  Visit your dentist for a routine exam and preventive care every 6 months. Brush your teeth twice a day and floss once a day. Good oral hygiene prevents tooth decay and gum disease.  The frequency of eye exams is based on your age, health, family medical history, use of contact lenses, and other factors. Follow your health care provider's recommendations for frequency of eye exams.  Eat a healthy diet. Foods like vegetables, fruits, whole grains, low-fat dairy products, and lean protein foods contain the nutrients you need without too many calories. Decrease your intake of foods high in solid fats, added sugars, and salt. Eat the right amount of calories for you.Get information about a proper diet from your health care provider, if necessary.  Regular physical exercise is one of the most important things you can do for your health. Most adults should get at least 150 minutes of moderate-intensity exercise (any activity that increases your heart rate and causes you to sweat) each week. In addition, most adults need muscle-strengthening exercises on 2 or more days a week.  Maintain a healthy weight. The body mass index (BMI) is a screening tool to identify possible weight problems. It provides an estimate of body fat based on height and weight. Your health care provider can find your BMI and can help you achieve or maintain a healthy weight.For adults 20 years and older:  A BMI below 18.5 is considered underweight.  A BMI of 18.5 to 24.9 is normal.  A BMI of 25 to 29.9 is considered overweight.  A  BMI of 30 and above is considered obese.  Maintain normal blood lipids and cholesterol levels by exercising and minimizing your intake of saturated fat. Eat a balanced diet with plenty of fruit and vegetables. Blood tests for lipids and cholesterol should begin at age 45 and be repeated every 5 years. If your lipid or cholesterol levels are high, you are over 50, or you are at high risk for heart disease, you may need your cholesterol levels checked more frequently.Ongoing high lipid and cholesterol levels should be treated with medicines if diet and exercise are not working.  If you smoke, find out from your health care provider how to quit. If you do not use tobacco, do not start.  Lung cancer screening is recommended for adults aged 45-80 years who are at high risk for developing lung cancer because of a history of smoking. A yearly low-dose CT scan of the lungs is recommended for people who have at least a 30-pack-year history of smoking and are a current smoker or have quit within the past 15 years. A pack year of smoking is smoking an average of 1 pack of cigarettes a day for 1 year (for example: 1 pack a day for 30 years or 2 packs a day for 15 years). Yearly screening should continue until the smoker has stopped smoking for at least 15 years. Yearly screening should be stopped for people who develop a health problem that would prevent them from having lung cancer treatment.  If you are pregnant, do not drink alcohol. If you are  breastfeeding, be very cautious about drinking alcohol. If you are not pregnant and choose to drink alcohol, do not have more than 1 drink per day. One drink is considered to be 12 ounces (355 mL) of beer, 5 ounces (148 mL) of wine, or 1.5 ounces (44 mL) of liquor.  Avoid use of street drugs. Do not share needles with anyone. Ask for help if you need support or instructions about stopping the use of drugs.  High blood pressure causes heart disease and increases the risk  of stroke. Your blood pressure should be checked at least every 1 to 2 years. Ongoing high blood pressure should be treated with medicines if weight loss and exercise do not work.  If you are 55-79 years old, ask your health care provider if you should take aspirin to prevent strokes.  Diabetes screening is done by taking a blood sample to check your blood glucose level after you have not eaten for a certain period of time (fasting). If you are not overweight and you do not have risk factors for diabetes, you should be screened once every 3 years starting at age 45. If you are overweight or obese and you are 40-70 years of age, you should be screened for diabetes every year as part of your cardiovascular risk assessment.  Breast cancer screening is essential preventive care for women. You should practice "breast self-awareness." This means understanding the normal appearance and feel of your breasts and may include breast self-examination. Any changes detected, no matter how small, should be reported to a health care provider. Women in their 20s and 30s should have a clinical breast exam (CBE) by a health care provider as part of a regular health exam every 1 to 3 years. After age 40, women should have a CBE every year. Starting at age 40, women should consider having a mammogram (breast X-ray test) every year. Women who have a family history of breast cancer should talk to their health care provider about genetic screening. Women at a high risk of breast cancer should talk to their health care providers about having an MRI and a mammogram every year.  Breast cancer gene (BRCA)-related cancer risk assessment is recommended for women who have family members with BRCA-related cancers. BRCA-related cancers include breast, ovarian, tubal, and peritoneal cancers. Having family members with these cancers may be associated with an increased risk for harmful changes (mutations) in the breast cancer genes BRCA1 and  BRCA2. Results of the assessment will determine the need for genetic counseling and BRCA1 and BRCA2 testing.  Your health care provider may recommend that you be screened regularly for cancer of the pelvic organs (ovaries, uterus, and vagina). This screening involves a pelvic examination, including checking for microscopic changes to the surface of your cervix (Pap test). You may be encouraged to have this screening done every 3 years, beginning at age 21.  For women ages 30-65, health care providers may recommend pelvic exams and Pap testing every 3 years, or they may recommend the Pap and pelvic exam, combined with testing for human papilloma virus (HPV), every 5 years. Some types of HPV increase your risk of cervical cancer. Testing for HPV may also be done on women of any age with unclear Pap test results.  Other health care providers may not recommend any screening for nonpregnant women who are considered low risk for pelvic cancer and who do not have symptoms. Ask your health care provider if a screening pelvic exam is right for   you.  If you have had past treatment for cervical cancer or a condition that could lead to cancer, you need Pap tests and screening for cancer for at least 20 years after your treatment. If Pap tests have been discontinued, your risk factors (such as having a new sexual partner) need to be reassessed to determine if screening should resume. Some women have medical problems that increase the chance of getting cervical cancer. In these cases, your health care provider may recommend more frequent screening and Pap tests.  Colorectal cancer can be detected and often prevented. Most routine colorectal cancer screening begins at the age of 50 years and continues through age 75 years. However, your health care provider may recommend screening at an earlier age if you have risk factors for colon cancer. On a yearly basis, your health care provider may provide home test kits to check  for hidden blood in the stool. Use of a small camera at the end of a tube, to directly examine the colon (sigmoidoscopy or colonoscopy), can detect the earliest forms of colorectal cancer. Talk to your health care provider about this at age 50, when routine screening begins. Direct exam of the colon should be repeated every 5-10 years through age 75 years, unless early forms of precancerous polyps or small growths are found.  People who are at an increased risk for hepatitis B should be screened for this virus. You are considered at high risk for hepatitis B if:  You were born in a country where hepatitis B occurs often. Talk with your health care provider about which countries are considered high risk.  Your parents were born in a high-risk country and you have not received a shot to protect against hepatitis B (hepatitis B vaccine).  You have HIV or AIDS.  You use needles to inject street drugs.  You live with, or have sex with, someone who has hepatitis B.  You get hemodialysis treatment.  You take certain medicines for conditions like cancer, organ transplantation, and autoimmune conditions.  Hepatitis C blood testing is recommended for all people born from 1945 through 1965 and any individual with known risks for hepatitis C.  Practice safe sex. Use condoms and avoid high-risk sexual practices to reduce the spread of sexually transmitted infections (STIs). STIs include gonorrhea, chlamydia, syphilis, trichomonas, herpes, HPV, and human immunodeficiency virus (HIV). Herpes, HIV, and HPV are viral illnesses that have no cure. They can result in disability, cancer, and death.  You should be screened for sexually transmitted illnesses (STIs) including gonorrhea and chlamydia if:  You are sexually active and are younger than 24 years.  You are older than 24 years and your health care provider tells you that you are at risk for this type of infection.  Your sexual activity has changed  since you were last screened and you are at an increased risk for chlamydia or gonorrhea. Ask your health care provider if you are at risk.  If you are at risk of being infected with HIV, it is recommended that you take a prescription medicine daily to prevent HIV infection. This is called preexposure prophylaxis (PrEP). You are considered at risk if:  You are sexually active and do not regularly use condoms or know the HIV status of your partner(s).  You take drugs by injection.  You are sexually active with a partner who has HIV.  Talk with your health care provider about whether you are at high risk of being infected with HIV. If   you choose to begin PrEP, you should first be tested for HIV. You should then be tested every 3 months for as long as you are taking PrEP.  Osteoporosis is a disease in which the bones lose minerals and strength with aging. This can result in serious bone fractures or breaks. The risk of osteoporosis can be identified using a bone density scan. Women ages 67 years and over and women at risk for fractures or osteoporosis should discuss screening with their health care providers. Ask your health care provider whether you should take a calcium supplement or vitamin D to reduce the rate of osteoporosis.  Menopause can be associated with physical symptoms and risks. Hormone replacement therapy is available to decrease symptoms and risks. You should talk to your health care provider about whether hormone replacement therapy is right for you.  Use sunscreen. Apply sunscreen liberally and repeatedly throughout the day. You should seek shade when your shadow is shorter than you. Protect yourself by wearing long sleeves, pants, a wide-brimmed hat, and sunglasses year round, whenever you are outdoors.  Once a month, do a whole body skin exam, using a mirror to look at the skin on your back. Tell your health care provider of new moles, moles that have irregular borders, moles that  are larger than a pencil eraser, or moles that have changed in shape or color.  Stay current with required vaccines (immunizations).  Influenza vaccine. All adults should be immunized every year.  Tetanus, diphtheria, and acellular pertussis (Td, Tdap) vaccine. Pregnant women should receive 1 dose of Tdap vaccine during each pregnancy. The dose should be obtained regardless of the length of time since the last dose. Immunization is preferred during the 27th-36th week of gestation. An adult who has not previously received Tdap or who does not know her vaccine status should receive 1 dose of Tdap. This initial dose should be followed by tetanus and diphtheria toxoids (Td) booster doses every 10 years. Adults with an unknown or incomplete history of completing a 3-dose immunization series with Td-containing vaccines should begin or complete a primary immunization series including a Tdap dose. Adults should receive a Td booster every 10 years.  Varicella vaccine. An adult without evidence of immunity to varicella should receive 2 doses or a second dose if she has previously received 1 dose. Pregnant females who do not have evidence of immunity should receive the first dose after pregnancy. This first dose should be obtained before leaving the health care facility. The second dose should be obtained 4-8 weeks after the first dose.  Human papillomavirus (HPV) vaccine. Females aged 13-26 years who have not received the vaccine previously should obtain the 3-dose series. The vaccine is not recommended for use in pregnant females. However, pregnancy testing is not needed before receiving a dose. If a female is found to be pregnant after receiving a dose, no treatment is needed. In that case, the remaining doses should be delayed until after the pregnancy. Immunization is recommended for any person with an immunocompromised condition through the age of 61 years if she did not get any or all doses earlier. During the  3-dose series, the second dose should be obtained 4-8 weeks after the first dose. The third dose should be obtained 24 weeks after the first dose and 16 weeks after the second dose.  Zoster vaccine. One dose is recommended for adults aged 30 years or older unless certain conditions are present.  Measles, mumps, and rubella (MMR) vaccine. Adults born  before 1957 generally are considered immune to measles and mumps. Adults born in 1957 or later should have 1 or more doses of MMR vaccine unless there is a contraindication to the vaccine or there is laboratory evidence of immunity to each of the three diseases. A routine second dose of MMR vaccine should be obtained at least 28 days after the first dose for students attending postsecondary schools, health care workers, or international travelers. People who received inactivated measles vaccine or an unknown type of measles vaccine during 1963-1967 should receive 2 doses of MMR vaccine. People who received inactivated mumps vaccine or an unknown type of mumps vaccine before 1979 and are at high risk for mumps infection should consider immunization with 2 doses of MMR vaccine. For females of childbearing age, rubella immunity should be determined. If there is no evidence of immunity, females who are not pregnant should be vaccinated. If there is no evidence of immunity, females who are pregnant should delay immunization until after pregnancy. Unvaccinated health care workers born before 1957 who lack laboratory evidence of measles, mumps, or rubella immunity or laboratory confirmation of disease should consider measles and mumps immunization with 2 doses of MMR vaccine or rubella immunization with 1 dose of MMR vaccine.  Pneumococcal 13-valent conjugate (PCV13) vaccine. When indicated, a person who is uncertain of his immunization history and has no record of immunization should receive the PCV13 vaccine. All adults 65 years of age and older should receive this  vaccine. An adult aged 19 years or older who has certain medical conditions and has not been previously immunized should receive 1 dose of PCV13 vaccine. This PCV13 should be followed with a dose of pneumococcal polysaccharide (PPSV23) vaccine. Adults who are at high risk for pneumococcal disease should obtain the PPSV23 vaccine at least 8 weeks after the dose of PCV13 vaccine. Adults older than 60 years of age who have normal immune system function should obtain the PPSV23 vaccine dose at least 1 year after the dose of PCV13 vaccine.  Pneumococcal polysaccharide (PPSV23) vaccine. When PCV13 is also indicated, PCV13 should be obtained first. All adults aged 65 years and older should be immunized. An adult younger than age 65 years who has certain medical conditions should be immunized. Any person who resides in a nursing home or long-term care facility should be immunized. An adult smoker should be immunized. People with an immunocompromised condition and certain other conditions should receive both PCV13 and PPSV23 vaccines. People with human immunodeficiency virus (HIV) infection should be immunized as soon as possible after diagnosis. Immunization during chemotherapy or radiation therapy should be avoided. Routine use of PPSV23 vaccine is not recommended for American Indians, [place] Natives, or people younger than 65 years unless there are medical conditions that require PPSV23 vaccine. When indicated, people who have unknown immunization and have no record of immunization should receive PPSV23 vaccine. One-time revaccination 5 years after the first dose of PPSV23 is recommended for people aged 19-64 years who have chronic kidney failure, nephrotic syndrome, asplenia, or immunocompromised conditions. People who received 1-2 doses of PPSV23 before age 65 years should receive another dose of PPSV23 vaccine at age 65 years or later if at least 5 years have passed since the previous dose. Doses of PPSV23 are not  needed for people immunized with PPSV23 at or after age 65 years.  Meningococcal vaccine. Adults with asplenia or persistent complement component deficiencies should receive 2 doses of quadrivalent meningococcal conjugate (MenACWY-D) vaccine. The doses should be obtained   at least 2 months apart. Microbiologists working with certain meningococcal bacteria, [place] recruits, people at risk during an outbreak, and people who travel to or live in countries with a high rate of meningitis should be immunized. A first-year college student up through age 34 years who is living in a residence hall should receive a dose if she did not receive a dose on or after her 16th birthday. Adults who have certain high-risk conditions should receive one or more doses of vaccine.  Hepatitis A vaccine. Adults who wish to be protected from this disease, have certain high-risk conditions, work with hepatitis A-infected animals, work in hepatitis A research labs, or travel to or work in countries with a high rate of hepatitis A should be immunized. Adults who were previously unvaccinated and who anticipate close contact with an international adoptee during the first 60 days after arrival in the [place] from a country with a high rate of hepatitis A should be immunized.  Hepatitis B vaccine. Adults who wish to be protected from this disease, have certain high-risk conditions, may be exposed to blood or other infectious body fluids, are household contacts or sex partners of hepatitis B positive people, are clients or workers in certain care facilities, or travel to or work in countries with a high rate of hepatitis B should be immunized.  Haemophilus influenzae type b (Hib) vaccine. A previously unvaccinated person with asplenia or sickle cell disease or having a scheduled splenectomy should receive 1 dose of Hib vaccine. Regardless of previous immunization, a recipient of a hematopoietic stem cell transplant should receive a  3-dose series 6-12 months after her successful transplant. Hib vaccine is not recommended for adults with HIV infection. Preventive Services / Frequency Ages 35 to 4 years  Blood pressure check.** / Every 3-5 years.  Lipid and cholesterol check.** / Every 5 years beginning at age 60.  Clinical breast exam.** / Every 3 years for women in their 71s and 10s.  BRCA-related cancer risk assessment.** / For women who have family members with a BRCA-related cancer (breast, ovarian, tubal, or peritoneal cancers).  Pap test.** / Every 2 years from ages 76 through 26. Every 3 years starting at age 61 through age 76 or 93 with a history of 3 consecutive normal Pap tests.  HPV screening.** / Every 3 years from ages 37 through ages 60 to 51 with a history of 3 consecutive normal Pap tests.  Hepatitis C blood test.** / For any individual with known risks for hepatitis C.  Skin self-exam. / Monthly.  Influenza vaccine. / Every year.  Tetanus, diphtheria, and acellular pertussis (Tdap, Td) vaccine.** / Consult your health care provider. Pregnant women should receive 1 dose of Tdap vaccine during each pregnancy. 1 dose of Td every 10 years.  Varicella vaccine.** / Consult your health care provider. Pregnant females who do not have evidence of immunity should receive the first dose after pregnancy.  HPV vaccine. / 3 doses over 6 months, if 93 and younger. The vaccine is not recommended for use in pregnant females. However, pregnancy testing is not needed before receiving a dose.  Measles, mumps, rubella (MMR) vaccine.** / You need at least 1 dose of MMR if you were born in 1957 or later. You may also need a 2nd dose. For females of childbearing age, rubella immunity should be determined. If there is no evidence of immunity, females who are not pregnant should be vaccinated. If there is no evidence of immunity, females who are  pregnant should delay immunization until after pregnancy.  Pneumococcal  13-valent conjugate (PCV13) vaccine.** / Consult your health care provider.  Pneumococcal polysaccharide (PPSV23) vaccine.** / 1 to 2 doses if you smoke cigarettes or if you have certain conditions.  Meningococcal vaccine.** / 1 dose if you are age 68 to 8 years and a Market researcher living in a residence hall, or have one of several medical conditions, you need to get vaccinated against meningococcal disease. You may also need additional booster doses.  Hepatitis A vaccine.** / Consult your health care provider.  Hepatitis B vaccine.** / Consult your health care provider.  Haemophilus influenzae type b (Hib) vaccine.** / Consult your health care provider. Ages 7 to 53 years  Blood pressure check.** / Every year.  Lipid and cholesterol check.** / Every 5 years beginning at age 25 years.  Lung cancer screening. / Every year if you are aged 11-80 years and have a 30-pack-year history of smoking and currently smoke or have quit within the past 15 years. Yearly screening is stopped once you have quit smoking for at least 15 years or develop a health problem that would prevent you from having lung cancer treatment.  Clinical breast exam.** / Every year after age 48 years.  BRCA-related cancer risk assessment.** / For women who have family members with a BRCA-related cancer (breast, ovarian, tubal, or peritoneal cancers).  Mammogram.** / Every year beginning at age 41 years and continuing for as long as you are in good health. Consult with your health care provider.  Pap test.** / Every 3 years starting at age 65 years through age 37 or 70 years with a history of 3 consecutive normal Pap tests.  HPV screening.** / Every 3 years from ages 72 years through ages 60 to 40 years with a history of 3 consecutive normal Pap tests.  Fecal occult blood test (FOBT) of stool. / Every year beginning at age 21 years and continuing until age 5 years. You may not need to do this test if you get  a colonoscopy every 10 years.  Flexible sigmoidoscopy or colonoscopy.** / Every 5 years for a flexible sigmoidoscopy or every 10 years for a colonoscopy beginning at age 35 years and continuing until age 48 years.  Hepatitis C blood test.** / For all people born from [birth year] through 1965 and any individual with known risks for hepatitis C.  Skin self-exam. / Monthly.  Influenza vaccine. / Every year.  Tetanus, diphtheria, and acellular pertussis (Tdap/Td) vaccine.** / Consult your health care provider. Pregnant women should receive 1 dose of Tdap vaccine during each pregnancy. 1 dose of Td every 10 years.  Varicella vaccine.** / Consult your health care provider. Pregnant females who do not have evidence of immunity should receive the first dose after pregnancy.  Zoster vaccine.** / 1 dose for adults aged 30 years or older.  Measles, mumps, rubella (MMR) vaccine.** / You need at least 1 dose of MMR if you were born in 1957 or later. You may also need a second dose. For females of childbearing age, rubella immunity should be determined. If there is no evidence of immunity, females who are not pregnant should be vaccinated. If there is no evidence of immunity, females who are pregnant should delay immunization until after pregnancy.  Pneumococcal 13-valent conjugate (PCV13) vaccine.** / Consult your health care provider.  Pneumococcal polysaccharide (PPSV23) vaccine.** / 1 to 2 doses if you smoke cigarettes or if you have certain conditions.  Meningococcal vaccine.** /  Consult your health care provider.  Hepatitis A vaccine.** / Consult your health care provider.  Hepatitis B vaccine.** / Consult your health care provider.  Haemophilus influenzae type b (Hib) vaccine.** / Consult your health care provider. Ages 64 years and over  Blood pressure check.** / Every year.  Lipid and cholesterol check.** / Every 5 years beginning at age 23 years.  Lung cancer screening. / Every year if you  are aged 16-80 years and have a 30-pack-year history of smoking and currently smoke or have quit within the past 15 years. Yearly screening is stopped once you have quit smoking for at least 15 years or develop a health problem that would prevent you from having lung cancer treatment.  Clinical breast exam.** / Every year after age 74 years.  BRCA-related cancer risk assessment.** / For women who have family members with a BRCA-related cancer (breast, ovarian, tubal, or peritoneal cancers).  Mammogram.** / Every year beginning at age 44 years and continuing for as long as you are in good health. Consult with your health care provider.  Pap test.** / Every 3 years starting at age 58 years through age 22 or 39 years with 3 consecutive normal Pap tests. Testing can be stopped between 65 and 70 years with 3 consecutive normal Pap tests and no abnormal Pap or HPV tests in the past 10 years.  HPV screening.** / Every 3 years from ages 64 years through ages 70 or 61 years with a history of 3 consecutive normal Pap tests. Testing can be stopped between 65 and 70 years with 3 consecutive normal Pap tests and no abnormal Pap or HPV tests in the past 10 years.  Fecal occult blood test (FOBT) of stool. / Every year beginning at age 40 years and continuing until age 27 years. You may not need to do this test if you get a colonoscopy every 10 years.  Flexible sigmoidoscopy or colonoscopy.** / Every 5 years for a flexible sigmoidoscopy or every 10 years for a colonoscopy beginning at age 7 years and continuing until age 32 years.  Hepatitis C blood test.** / For all people born from [birth year] through 1965 and any individual with known risks for hepatitis C.  Osteoporosis screening.** / A one-time screening for women ages 30 years and over and women at risk for fractures or osteoporosis.  Skin self-exam. / Monthly.  Influenza vaccine. / Every year.  Tetanus, diphtheria, and acellular pertussis (Tdap/Td)  vaccine.** / 1 dose of Td every 10 years.  Varicella vaccine.** / Consult your health care provider.  Zoster vaccine.** / 1 dose for adults aged 35 years or older.  Pneumococcal 13-valent conjugate (PCV13) vaccine.** / Consult your health care provider.  Pneumococcal polysaccharide (PPSV23) vaccine.** / 1 dose for all adults aged 46 years and older.  Meningococcal vaccine.** / Consult your health care provider.  Hepatitis A vaccine.** / Consult your health care provider.  Hepatitis B vaccine.** / Consult your health care provider.  Haemophilus influenzae type b (Hib) vaccine.** / Consult your health care provider. ** Family history and personal history of risk and conditions may change your health care provider's recommendations.   This information is not intended to replace advice given to you by your health care provider. Make sure you discuss any questions you have with your health care provider.   Document Released: 10/07/2001 Document Revised: 09/01/2014 Document Reviewed: 01/06/2011 Elsevier Interactive Patient Education [employer].

## 2016-03-13 NOTE — Progress Notes (Signed)
Pre visit review using our clinic review tool, if applicable. No additional management support is needed unless otherwise documented below in the visit note. 

## 2016-03-13 NOTE — Progress Notes (Signed)
Subjective:  Patient ID: Holly Fowler, female    DOB: 1956-03-18  Age: 60 y.o. MRN: [id number]  CC: Hypertension; Annual Exam; and Hyperlipidemia   HPI [name] presents for a CPX.  She tells me her blood pressure has been well controlled with a combination of chlorthalidone and verapamil. She has had no recent episodes of headache/blurred vision/chest pain/palpitations/SOB/DOE/edema/palpitations/or near syncope.  She is being monitored for a mild elevation in her white blood cell count that appears to be attributed to tobacco abuse. She has not been able to quit smoking.    Outpatient Prescriptions Prior to Visit  Medication Sig Dispense Refill  . triamcinolone cream (KENALOG) 0.5 % Apply topically 2 (two) times daily as needed. Use sparingly as this medication can bleach your skin. 30 g 3  . chlorthalidone (HYGROTON) 25 MG tablet Take 1 tablet (25 mg total) by mouth daily. 90 tablet 2  . verapamil (CALAN-SR) 240 MG CR tablet Take 1 tablet (240 mg total) by mouth at bedtime. 90 tablet 2   No facility-administered medications prior to visit.    ROS Review of Systems  Constitutional: Negative.  Negative for fever, chills and fatigue.  HENT: Negative.  Negative for sinus pressure, sore throat and trouble swallowing.   Eyes: Negative.  Negative for visual disturbance.  Respiratory: Negative.  Negative for apnea, cough, choking, shortness of breath and stridor.   Cardiovascular: Negative.  Negative for chest pain, palpitations and leg swelling.  Gastrointestinal: Negative.  Negative for nausea, vomiting, abdominal pain, diarrhea, constipation and blood in stool.  Endocrine: Negative.   Genitourinary: Negative.  Negative for dysuria, urgency, frequency, hematuria, decreased urine volume and difficulty urinating.  Musculoskeletal: Negative.  Negative for myalgias, back pain and neck pain.  Skin: Negative.  Negative for color change and rash.  Allergic/Immunologic: Negative.     Neurological: Negative.  Negative for dizziness, tremors, syncope, weakness, light-headedness, numbness and headaches.  Hematological: Negative.  Negative for adenopathy. Does not bruise/bleed easily.  Psychiatric/Behavioral: Negative.     Objective:  BP 118/74 mmHg  Pulse 85  Temp(Src) 98.2 F (36.8 C) (Oral)  Resp 16  Ht 5\' 3"  (1.6 m)  Wt 219 lb 6 oz (99.508 kg)  BMI 38.87 kg/m2  SpO2 97%  BP Readings from Last 3 Encounters:  03/13/16 118/74  08/21/15 130/80  02/19/15 152/88    Wt Readings from Last 3 Encounters:  03/13/16 219 lb 6 oz (99.508 kg)  08/21/15 218 lb (98.884 kg)  02/19/15 222 lb 1.3 oz (100.735 kg)    Physical Exam  Constitutional: She is oriented to person, place, and time. No distress.  HENT:  Mouth/Throat: Oropharynx is clear and moist. No oropharyngeal exudate.  Eyes: Conjunctivae are normal. Right eye exhibits no discharge. Left eye exhibits no discharge. No scleral icterus.  Neck: Normal range of motion. Neck supple. No JVD present. No tracheal deviation present. No thyromegaly present.  Cardiovascular: Normal rate, regular rhythm, normal heart sounds and intact distal pulses.  Exam reveals no gallop and no friction rub.   No murmur heard. Pulmonary/Chest: Effort normal and breath sounds normal. No stridor. No respiratory distress. She has no wheezes. She has no rales. She exhibits no tenderness.  Abdominal: Soft. Bowel sounds are normal. She exhibits no distension and no mass. There is no tenderness. There is no rebound and no guarding.  Musculoskeletal: Normal range of motion. She exhibits no edema or tenderness.  Lymphadenopathy:    She has no cervical adenopathy.  Neurological: She  is oriented to person, place, and time.  Skin: Skin is warm and dry. No rash noted. She is not diaphoretic. No erythema. No pallor.  Psychiatric: She has a normal mood and affect. Her behavior is normal. Judgment and thought content normal.  Vitals reviewed.   Lab  Results  Component Value Date   WBC 14.6* 03/13/2016   HGB 13.0 03/13/2016   HCT 40.2 03/13/2016   PLT 402.0* 03/13/2016   GLUCOSE 102* 03/13/2016   CHOL 200 03/13/2016   TRIG 86.0 03/13/2016   HDL 44.50 03/13/2016   LDLCALC 139* 03/13/2016   ALT 14 03/13/2016   AST 15 03/13/2016   NA 138 03/13/2016   K 4.4 03/13/2016   CL 103 03/13/2016   CREATININE 1.30* 03/13/2016   BUN 20 03/13/2016   CO2 27 03/13/2016   TSH 1.52 03/13/2016   HGBA1C 5.3 03/13/2016    No results found.  Assessment & Plan:   Aditri was seen today for hypertension, annual exam and hyperlipidemia.  Diagnoses and all orders for this visit:  Leukocytosis- she has a mild but stable increase in her white blood cell count and a slight increase in her platelet count, she is not anemic and all of her other cell lines are normal, this is consistent with a chronic inflammatory process of tobacco abuse. She was asked to quit smoking.  Kidney disease, chronic, stage III (GFR 30-59 ml/min)- her renal function is stable, her blood pressure is well-controlled, she agrees to avoid nephrotoxic agents. -     chlorthalidone (HYGROTON) 25 MG tablet; Take 1 tablet (25 mg total) by mouth daily. -     verapamil (CALAN-SR) 240 MG CR tablet; Take 1 tablet (240 mg total) by mouth at bedtime.  Essential hypertension- her blood pressure is well-controlled on the current regimen and her renal function is stable, electrolytes are normal. -     chlorthalidone (HYGROTON) 25 MG tablet; Take 1 tablet (25 mg total) by mouth daily. -     verapamil (CALAN-SR) 240 MG CR tablet; Take 1 tablet (240 mg total) by mouth at bedtime.  Routine general medical examination at a health care facility- exam completed, labs ordered and reviewed, her Pap smears up-to-date, she was referred for a mammogram, vaccines were reviewed and updated, her colonoscopy is up-to-date, patient education material was given. -     Lipid panel; Future -     Comprehensive  metabolic panel; Future -     CBC with Differential/Platelet; Future -     TSH; Future -     Hepatitis C antibody; Future -     HIV antibody; Future  Hyperglycemia- improvement noted, she is working on her lifestyle modifications to lower her blood sugar. -     Hemoglobin A1c; Future  Need for prophylactic vaccination against Streptococcus pneumoniae (pneumococcus) -     Pneumococcal conjugate vaccine 13-valent  Smoker unmotivated to quit -     Ambulatory Referral for Lung Cancer Scre  Tobacco abuse disorder- I've asked her to go a screening test for lung cancer -     Ambulatory Referral for Lung Cancer Scre  Visit for screening mammogram -     MM DIGITAL SCREENING BILATERAL; Future  Hyperlipidemia with target LDL less than 100- her Framingham risk score is only 5% so I do not recommend that she start taking a statin.   I am having Ms. Denardis maintain her triamcinolone cream, chlorthalidone, and verapamil.  Meds ordered this encounter  Medications  . chlorthalidone (HYGROTON)  25 MG tablet    Sig: Take 1 tablet (25 mg total) by mouth daily.    Dispense:  90 tablet    Refill:  1  . verapamil (CALAN-SR) 240 MG CR tablet    Sig: Take 1 tablet (240 mg total) by mouth at bedtime.    Dispense:  90 tablet    Refill:  1     Follow-up: Return in about 6 months (around 09/13/2016).  [name], MD

## 2016-03-14 LAB — HEPATITIS C ANTIBODY: HCV Ab: NEGATIVE

## 2016-03-14 LAB — HIV ANTIBODY (ROUTINE TESTING W REFLEX): HIV: NONREACTIVE

## 2016-03-15 ENCOUNTER — Other Ambulatory Visit: Payer: Self-pay | Admitting: Internal Medicine

## 2016-03-15 ENCOUNTER — Encounter: Payer: Self-pay | Admitting: Internal Medicine

## 2016-09-26 ENCOUNTER — Other Ambulatory Visit: Payer: Self-pay | Admitting: *Deleted

## 2016-09-26 DIAGNOSIS — N183 Chronic kidney disease, stage 3 unspecified: Secondary | ICD-10-CM

## 2016-09-26 DIAGNOSIS — I10 Essential (primary) hypertension: Secondary | ICD-10-CM

## 2016-09-26 MED ORDER — CHLORTHALIDONE 25 MG PO TABS: 25.0000 mg | ORAL_TABLET | Freq: Every day | ORAL | 0 refills | Status: DC

## 2016-09-26 MED ORDER — VERAPAMIL HCL ER 240 MG PO TBCR: 240.0000 mg | EXTENDED_RELEASE_TABLET | Freq: Every day | ORAL | 0 refills | Status: DC

## 2016-09-26 NOTE — Telephone Encounter (Signed)
Rec'd call pt states she needs refill on her BP medications her f/u appt is not until 2/26, and she only have 2 days left. Sent in 30 day supply to [facility] until appt.Holly KitchenJohny Chess

## 2016-10-20 ENCOUNTER — Encounter: Payer: Self-pay | Admitting: Internal Medicine

## 2016-10-20 ENCOUNTER — Ambulatory Visit (INDEPENDENT_AMBULATORY_CARE_PROVIDER_SITE_OTHER): Payer: BC Managed Care – PPO | Admitting: Internal Medicine

## 2016-10-20 ENCOUNTER — Other Ambulatory Visit (INDEPENDENT_AMBULATORY_CARE_PROVIDER_SITE_OTHER): Payer: BC Managed Care – PPO

## 2016-10-20 VITALS — BP 130/80 | HR 75 | Temp 98.3°F | Resp 16 | Ht 63.0 in | Wt 222.0 lb

## 2016-10-20 DIAGNOSIS — N183 Chronic kidney disease, stage 3 unspecified: Secondary | ICD-10-CM

## 2016-10-20 DIAGNOSIS — I10 Essential (primary) hypertension: Secondary | ICD-10-CM

## 2016-10-20 DIAGNOSIS — Z23 Encounter for immunization: Secondary | ICD-10-CM | POA: Diagnosis not present

## 2016-10-20 DIAGNOSIS — D72829 Elevated white blood cell count, unspecified: Secondary | ICD-10-CM | POA: Diagnosis not present

## 2016-10-20 DIAGNOSIS — J309 Allergic rhinitis, unspecified: Secondary | ICD-10-CM | POA: Insufficient documentation

## 2016-10-20 DIAGNOSIS — J301 Allergic rhinitis due to pollen: Secondary | ICD-10-CM | POA: Diagnosis not present

## 2016-10-20 LAB — URINALYSIS, ROUTINE W REFLEX MICROSCOPIC
HGB URINE DIPSTICK: NEGATIVE
Ketones, ur: NEGATIVE
LEUKOCYTES UA: NEGATIVE
Nitrite: NEGATIVE
Specific Gravity, Urine: 1.025 (ref 1.000–1.030)
TOTAL PROTEIN, URINE-UPE24: NEGATIVE
URINE GLUCOSE: NEGATIVE
Urobilinogen, UA: 1 (ref 0.0–1.0)
pH: 6.5 (ref 5.0–8.0)

## 2016-10-20 LAB — COMPREHENSIVE METABOLIC PANEL
ALT: 13 U/L (ref 0–35)
AST: 15 U/L (ref 0–37)
Albumin: 4 g/dL (ref 3.5–5.2)
Alkaline Phosphatase: 126 U/L — ABNORMAL HIGH (ref 39–117)
BILIRUBIN TOTAL: 0.4 mg/dL (ref 0.2–1.2)
BUN: 17 mg/dL (ref 6–23)
CALCIUM: 9.5 mg/dL (ref 8.4–10.5)
CO2: 22 meq/L (ref 19–32)
Chloride: 107 mEq/L (ref 96–112)
Creatinine, Ser: 1.28 mg/dL — ABNORMAL HIGH (ref 0.40–1.20)
GFR: 54.6 mL/min — AB (ref >60.00)
GLUCOSE: 114 mg/dL — AB (ref 70–99)
POTASSIUM: 4.1 meq/L (ref 3.5–5.1)
Sodium: 138 mEq/L (ref 135–145)
Total Protein: 7.1 g/dL (ref 6.0–8.3)

## 2016-10-20 LAB — CBC WITH DIFFERENTIAL/PLATELET
Basophils Absolute: 0.2 10*3/uL — ABNORMAL HIGH (ref 0.0–0.1)
Basophils Relative: 1.2 % (ref 0.0–3.0)
Eosinophils Absolute: 0.1 10*3/uL (ref 0.0–0.7)
Eosinophils Relative: 0.5 % (ref 0.0–5.0)
HEMATOCRIT: 38.7 % (ref 36.0–46.0)
Hemoglobin: 12.5 g/dL (ref 12.0–15.0)
LYMPHS ABS: 2.7 10*3/uL (ref 0.7–4.0)
LYMPHS PCT: 18.8 % (ref 12.0–46.0)
MCHC: 32.4 g/dL (ref 30.0–36.0)
MCV: 80.2 fl (ref 78.0–100.0)
MONOS PCT: 6.3 % (ref 3.0–12.0)
Monocytes Absolute: 0.9 10*3/uL (ref 0.1–1.0)
NEUTROS ABS: 10.3 10*3/uL — AB (ref 1.4–7.7)
NEUTROS PCT: 73.2 % (ref 43.0–77.0)
PLATELETS: 372 10*3/uL (ref 150.0–400.0)
RBC: 4.83 Mil/uL (ref 3.87–5.11)
RDW: 14 % (ref 11.5–15.5)
WBC: 14.1 10*3/uL — ABNORMAL HIGH (ref 4.0–10.5)

## 2016-10-20 LAB — MAGNESIUM: MAGNESIUM: 2.1 mg/dL (ref 1.5–2.5)

## 2016-10-20 MED ORDER — TRIAMCINOLONE ACETONIDE 55 MCG/ACT NA AERO: 2.0000 | INHALATION_SPRAY | Freq: Two times a day (BID) | NASAL | 3 refills | Status: DC

## 2016-10-20 MED ORDER — VERAPAMIL HCL ER 240 MG PO TBCR: 240.0000 mg | EXTENDED_RELEASE_TABLET | Freq: Every day | ORAL | 1 refills | Status: DC

## 2016-10-20 MED ORDER — LEVOCETIRIZINE DIHYDROCHLORIDE 5 MG PO TABS: 5.0000 mg | ORAL_TABLET | Freq: Every evening | ORAL | 3 refills | Status: DC

## 2016-10-20 NOTE — Progress Notes (Signed)
Pre visit review using our clinic review tool, if applicable. No additional management support is needed unless otherwise documented below in the visit note. 

## 2016-10-20 NOTE — Progress Notes (Signed)
Subjective:  Patient ID: Holly Fowler, female    DOB: 08-20-1956  Age: 61 y.o. MRN: [id number]  CC: Hypertension and Allergic Rhinitis    HPI Holly Fowler presents for a BP check - she complains of a 2 week hx of runny nose, nasal congestion, and sneezing but no facial pain, fever, chills, or sore throat. Her BP has been well controlled.  Outpatient Medications Prior to Visit  Medication Sig Dispense Refill  . chlorthalidone (HYGROTON) 25 MG tablet Take 1 tablet (25 mg total) by mouth daily. Keep 10/20/16 appt for future refills 30 tablet 0  . triamcinolone cream (KENALOG) 0.5 % Apply topically 2 (two) times daily as needed. Use sparingly as this medication can bleach your skin. 30 g 3  . verapamil (CALAN-SR) 240 MG CR tablet Take 1 tablet (240 mg total) by mouth at bedtime. Keep 10/20/16 appt for future refills 30 tablet 0   No facility-administered medications prior to visit.     ROS Review of Systems  Constitutional: Negative.  Negative for chills, diaphoresis, fatigue and fever.  HENT: Positive for congestion, postnasal drip and rhinorrhea. Negative for ear pain, facial swelling, nosebleeds, sinus pain, sinus pressure, sneezing, sore throat, tinnitus, trouble swallowing and voice change.   Eyes: Negative.  Negative for visual disturbance.  Respiratory: Negative for cough, chest tightness, shortness of breath and wheezing.   Cardiovascular: Negative for chest pain, palpitations and leg swelling.  Gastrointestinal: Negative for abdominal pain, constipation, diarrhea, nausea and vomiting.  Endocrine: Negative.   Genitourinary: Negative.  Negative for difficulty urinating.  Musculoskeletal: Negative.  Negative for back pain, myalgias and neck pain.  Skin: Negative.  Negative for color change and rash.  Allergic/Immunologic: Negative.   Neurological: Negative.  Negative for dizziness, weakness, numbness and headaches.  Hematological: Negative.  Negative for adenopathy. Does not  bruise/bleed easily.  Psychiatric/Behavioral: Negative.     Objective:  BP 130/80 (BP Location: Left Arm, Patient Position: Sitting, Cuff Size: Large)   Pulse 75   Temp 98.3 F (36.8 C) (Oral)   Resp 16   Ht 5\' 3"  (1.6 m)   Wt 222 lb (100.7 kg)   SpO2 95%   BMI 39.33 kg/m   BP Readings from Last 3 Encounters:  10/20/16 130/80  03/13/16 118/74  08/21/15 130/80    Wt Readings from Last 3 Encounters:  10/20/16 222 lb (100.7 kg)  03/13/16 219 lb 6 oz (99.5 kg)  08/21/15 218 lb (98.9 kg)    Physical Exam  Constitutional: She is oriented to person, place, and time. No distress.  HENT:  Right Ear: Hearing, tympanic membrane, external ear and ear canal normal.  Left Ear: Hearing, tympanic membrane, external ear and ear canal normal.  Nose: Mucosal edema present. No rhinorrhea or sinus tenderness. Right sinus exhibits no maxillary sinus tenderness and no frontal sinus tenderness. Left sinus exhibits no maxillary sinus tenderness and no frontal sinus tenderness.  Mouth/Throat: Oropharynx is clear and moist. Mucous membranes are not pale, not dry and not cyanotic. No oropharyngeal exudate, posterior oropharyngeal edema, posterior oropharyngeal erythema or tonsillar abscesses.  Eyes: Conjunctivae are normal. Right eye exhibits no discharge. Left eye exhibits no discharge. No scleral icterus.  Neck: Normal range of motion. Neck supple. No JVD present. No tracheal deviation present. No thyromegaly present.  Cardiovascular: Normal rate, regular rhythm, normal heart sounds and intact distal pulses.  Exam reveals no gallop and no friction rub.   No murmur heard. Pulmonary/Chest: Effort normal and breath sounds normal.  No stridor. No respiratory distress. She has no wheezes. She has no rales. She exhibits no tenderness.  Abdominal: Soft. Bowel sounds are normal. She exhibits no distension and no mass. There is no tenderness. There is no rebound and no guarding.  Musculoskeletal: Normal range  of motion. She exhibits no edema, tenderness or deformity.  Lymphadenopathy:    She has no cervical adenopathy.  Neurological: She is oriented to person, place, and time.  Skin: Skin is warm and dry. No rash noted. She is not diaphoretic. No erythema. No pallor.  Vitals reviewed.   Lab Results  Component Value Date   WBC 14.6 (H) 03/13/2016   HGB 13.0 03/13/2016   HCT 40.2 03/13/2016   PLT 402.0 (H) 03/13/2016   GLUCOSE 102 (H) 03/13/2016   CHOL 200 03/13/2016   TRIG 86.0 03/13/2016   HDL 44.50 03/13/2016   LDLCALC 139 (H) 03/13/2016   ALT 14 03/13/2016   AST 15 03/13/2016   NA 138 03/13/2016   K 4.4 03/13/2016   CL 103 03/13/2016   CREATININE 1.30 (H) 03/13/2016   BUN 20 03/13/2016   CO2 27 03/13/2016   TSH 1.52 03/13/2016   HGBA1C 5.3 03/13/2016    No results found.  Assessment & Plan:   Holly Fowler was seen today for hypertension and allergic rhinitis .  Diagnoses and all orders for this visit:  Leukocytosis, unspecified type- White blood cell count remains elevated but she is not anemic and the remainder of her cell lines are normal. This is consistent with her history of tobacco abuse. -     CBC with Differential/Platelet; Future  Essential hypertension- her blood pressure is adequately well controlled, electrolytes are normal. -     Comprehensive metabolic panel; Future -     Magnesium; Future -     verapamil (CALAN-SR) 240 MG CR tablet; Take 1 tablet (240 mg total) by mouth at bedtime.  Kidney disease, chronic, stage III (GFR 30-59 ml/min)- her renal function is stable, she will continue to avoid nephrotoxic agents. I've asked her to quit smoking. Will continue to maintain good blood pressure control. -     Comprehensive metabolic panel; Future -     Urinalysis, Routine w reflex microscopic; Future -     verapamil (CALAN-SR) 240 MG CR tablet; Take 1 tablet (240 mg total) by mouth at bedtime.  Need for prophylactic vaccination and inoculation against influenza -      Flu Vaccine QUAD 36+ mos IM  Acute nonseasonal allergic rhinitis due to pollen- I have asked her to start treating this with an oral antihistamine and steroid nasal spray. -     levocetirizine (XYZAL) 5 MG tablet; Take 1 tablet (5 mg total) by mouth every evening. -     triamcinolone (NASACORT AQ) 55 MCG/ACT AERO nasal inhaler; Place 2 sprays into the nose 2 (two) times daily.   I have changed Holly Fowler's verapamil. I am also having her start on levocetirizine and triamcinolone. Additionally, I am having her maintain her triamcinolone cream and chlorthalidone.  Meds ordered this encounter  Medications  . levocetirizine (XYZAL) 5 MG tablet    Sig: Take 1 tablet (5 mg total) by mouth every evening.    Dispense:  90 tablet    Refill:  3  . triamcinolone (NASACORT AQ) 55 MCG/ACT AERO nasal inhaler    Sig: Place 2 sprays into the nose 2 (two) times daily.    Dispense:  3 Inhaler    Refill:  3  .  verapamil (CALAN-SR) 240 MG CR tablet    Sig: Take 1 tablet (240 mg total) by mouth at bedtime.    Dispense:  90 tablet    Refill:  1     Follow-up: Return in about 6 months (around 04/19/2017).  [name], MD

## 2016-10-20 NOTE — Patient Instructions (Signed)
Hypertension Hypertension, commonly called high blood pressure, is when the force of blood pumping through your arteries is too strong. Your arteries are the blood vessels that carry blood from your heart throughout your body. A blood pressure reading consists of a higher number over a lower number, such as 110/72. The higher number (systolic) is the pressure inside your arteries when your heart pumps. The lower number (diastolic) is the pressure inside your arteries when your heart relaxes. Ideally you want your blood pressure below 120/80. Hypertension forces your heart to work harder to pump blood. Your arteries may become narrow or stiff. Having untreated or uncontrolled hypertension can cause heart attack, stroke, kidney disease, and other problems. What increases the risk? Some risk factors for high blood pressure are controllable. Others are not. Risk factors you cannot control include:  Race. You may be at higher risk if you are African American.  Age. Risk increases with age.  Gender. Men are at higher risk than women before age 45 years. After age 65, women are at higher risk than men. Risk factors you can control include:  Not getting enough exercise or physical activity.  Being overweight.  Getting too much fat, sugar, calories, or salt in your diet.  Drinking too much alcohol. What are the signs or symptoms? Hypertension does not usually cause signs or symptoms. Extremely high blood pressure (hypertensive crisis) may cause headache, anxiety, shortness of breath, and nosebleed. How is this diagnosed? To check if you have hypertension, your health care provider will measure your blood pressure while you are seated, with your arm held at the level of your heart. It should be measured at least twice using the same arm. Certain conditions can cause a difference in blood pressure between your right and left arms. A blood pressure reading that is higher than normal on one occasion does  not mean that you need treatment. If it is not clear whether you have high blood pressure, you may be asked to return on a different day to have your blood pressure checked again. Or, you may be asked to monitor your blood pressure at home for 1 or more weeks. How is this treated? Treating high blood pressure includes making lifestyle changes and possibly taking medicine. Living a healthy lifestyle can help lower high blood pressure. You may need to change some of your habits. Lifestyle changes may include:  Following the DASH diet. This diet is high in fruits, vegetables, and whole grains. It is low in salt, red meat, and added sugars.  Keep your sodium intake below 2,300 mg per day.  Getting at least 30-45 minutes of aerobic exercise at least 4 times per week.  Losing weight if necessary.  Not smoking.  Limiting alcoholic beverages.  Learning ways to reduce stress. Your health care provider may prescribe medicine if lifestyle changes are not enough to get your blood pressure under control, and if one of the following is true:  You are 18-59 years of age and your systolic blood pressure is above 140.  You are 60 years of age or older, and your systolic blood pressure is above 150.  Your diastolic blood pressure is above 90.  You have diabetes, and your systolic blood pressure is over 140 or your diastolic blood pressure is over 90.  You have kidney disease and your blood pressure is above 140/90.  You have heart disease and your blood pressure is above 140/90. Your personal target blood pressure may vary depending on your medical   conditions, your age, and other factors. Follow these instructions at home:  Have your blood pressure rechecked as directed by your health care provider.  Take medicines only as directed by your health care provider. Follow the directions carefully. Blood pressure medicines must be taken as prescribed. The medicine does not work as well when you skip  doses. Skipping doses also puts you at risk for problems.  Do not smoke.  Monitor your blood pressure at home as directed by your health care provider. Contact a health care provider if:  You think you are having a reaction to medicines taken.  You have recurrent headaches or feel dizzy.  You have swelling in your ankles.  You have trouble with your vision. Get help right away if:  You develop a severe headache or confusion.  You have unusual weakness, numbness, or feel faint.  You have severe chest or abdominal pain.  You vomit repeatedly.  You have trouble breathing. This information is not intended to replace advice given to you by your health care provider. Make sure you discuss any questions you have with your health care provider. Document Released: 08/11/2005 Document Revised: 01/17/2016 Document Reviewed: 06/03/2013 Elsevier Interactive Patient Education  2017 [employer]  

## 2016-11-19 ENCOUNTER — Other Ambulatory Visit: Payer: Self-pay | Admitting: Internal Medicine

## 2016-11-19 ENCOUNTER — Telehealth: Payer: Self-pay | Admitting: Internal Medicine

## 2016-11-19 DIAGNOSIS — N183 Chronic kidney disease, stage 3 unspecified: Secondary | ICD-10-CM

## 2016-11-19 DIAGNOSIS — I10 Essential (primary) hypertension: Secondary | ICD-10-CM

## 2016-11-19 MED ORDER — CHLORTHALIDONE 25 MG PO TABS: 25.0000 mg | ORAL_TABLET | Freq: Every day | ORAL | 0 refills | Status: DC

## 2016-11-19 NOTE — Telephone Encounter (Signed)
Pt called in and needs refill on chlorthalidone (HYGROTON) 25 MG tablet [[id number]

## 2016-11-20 NOTE — Telephone Encounter (Signed)
erx sent by PCP. Closing note.

## 2017-03-19 ENCOUNTER — Other Ambulatory Visit: Payer: Self-pay | Admitting: Internal Medicine

## 2017-03-19 DIAGNOSIS — N183 Chronic kidney disease, stage 3 unspecified: Secondary | ICD-10-CM

## 2017-03-19 DIAGNOSIS — I10 Essential (primary) hypertension: Secondary | ICD-10-CM

## 2017-04-14 ENCOUNTER — Encounter: Payer: Self-pay | Admitting: Internal Medicine

## 2017-04-14 ENCOUNTER — Other Ambulatory Visit (INDEPENDENT_AMBULATORY_CARE_PROVIDER_SITE_OTHER): Payer: BC Managed Care – PPO

## 2017-04-14 ENCOUNTER — Other Ambulatory Visit (HOSPITAL_COMMUNITY)
Admission: RE | Admit: 2017-04-14 | Discharge: 2017-04-14 | Disposition: A | Discharge status: Home / Self Care | Payer: BC Managed Care – PPO | Source: Ambulatory Visit | Attending: Internal Medicine | Admitting: Internal Medicine

## 2017-04-14 ENCOUNTER — Ambulatory Visit (INDEPENDENT_AMBULATORY_CARE_PROVIDER_SITE_OTHER): Payer: BC Managed Care – PPO | Admitting: Internal Medicine

## 2017-04-14 VITALS — BP 120/70 | HR 72 | Temp 97.6°F | Resp 16 | Ht 63.0 in | Wt 221.2 lb

## 2017-04-14 DIAGNOSIS — Z1231 Encounter for screening mammogram for malignant neoplasm of breast: Secondary | ICD-10-CM

## 2017-04-14 DIAGNOSIS — I129 Hypertensive chronic kidney disease with stage 1 through stage 4 chronic kidney disease, or unspecified chronic kidney disease: Secondary | ICD-10-CM | POA: Diagnosis not present

## 2017-04-14 DIAGNOSIS — N183 Chronic kidney disease, stage 3 unspecified: Secondary | ICD-10-CM

## 2017-04-14 DIAGNOSIS — R739 Hyperglycemia, unspecified: Secondary | ICD-10-CM | POA: Diagnosis not present

## 2017-04-14 DIAGNOSIS — D72829 Elevated white blood cell count, unspecified: Secondary | ICD-10-CM | POA: Insufficient documentation

## 2017-04-14 DIAGNOSIS — F17200 Nicotine dependence, unspecified, uncomplicated: Secondary | ICD-10-CM | POA: Insufficient documentation

## 2017-04-14 DIAGNOSIS — Z124 Encounter for screening for malignant neoplasm of cervix: Secondary | ICD-10-CM

## 2017-04-14 DIAGNOSIS — Z0000 Encounter for general adult medical examination without abnormal findings: Secondary | ICD-10-CM | POA: Diagnosis not present

## 2017-04-14 DIAGNOSIS — E785 Hyperlipidemia, unspecified: Secondary | ICD-10-CM | POA: Insufficient documentation

## 2017-04-14 DIAGNOSIS — I10 Essential (primary) hypertension: Secondary | ICD-10-CM | POA: Diagnosis not present

## 2017-04-14 LAB — CBC WITH DIFFERENTIAL/PLATELET
Basophils Absolute: 0.1 10*3/uL (ref 0.0–0.1)
Basophils Relative: 0.6 % (ref 0.0–3.0)
Eosinophils Absolute: 0.1 10*3/uL (ref 0.0–0.7)
Eosinophils Relative: 0.5 % (ref 0.0–5.0)
HCT: 41.3 % (ref 36.0–46.0)
Hemoglobin: 13.2 g/dL (ref 12.0–15.0)
Lymphocytes Relative: 18.1 % (ref 12.0–46.0)
Lymphs Abs: 2.5 10*3/uL (ref 0.7–4.0)
MCHC: 32 g/dL (ref 30.0–36.0)
MCV: 82.3 fl (ref 78.0–100.0)
Monocytes Absolute: 0.9 10*3/uL (ref 0.1–1.0)
Monocytes Relative: 6.3 % (ref 3.0–12.0)
Neutro Abs: 10.4 10*3/uL — ABNORMAL HIGH (ref 1.4–7.7)
Neutrophils Relative %: 74.5 % (ref 43.0–77.0)
Platelets: 371 10*3/uL (ref 150.0–400.0)
RBC: 5.01 Mil/uL (ref 3.87–5.11)
RDW: 14.1 % (ref 11.5–15.5)
WBC: 13.9 10*3/uL — ABNORMAL HIGH (ref 4.0–10.5)

## 2017-04-14 LAB — COMPREHENSIVE METABOLIC PANEL WITH GFR
ALT: 14 U/L (ref 0–35)
AST: 14 U/L (ref 0–37)
Albumin: 3.9 g/dL (ref 3.5–5.2)
Alkaline Phosphatase: 139 U/L — ABNORMAL HIGH (ref 39–117)
BUN: 17 mg/dL (ref 6–23)
CO2: 29 meq/L (ref 19–32)
Calcium: 9.8 mg/dL (ref 8.4–10.5)
Chloride: 102 meq/L (ref 96–112)
Creatinine, Ser: 1.28 mg/dL — ABNORMAL HIGH (ref 0.40–1.20)
GFR: 54.52 mL/min — ABNORMAL LOW
Glucose, Bld: 111 mg/dL — ABNORMAL HIGH (ref 70–99)
Potassium: 4.2 meq/L (ref 3.5–5.1)
Sodium: 138 meq/L (ref 135–145)
Total Bilirubin: 0.5 mg/dL (ref 0.2–1.2)
Total Protein: 7.8 g/dL (ref 6.0–8.3)

## 2017-04-14 LAB — HEMOGLOBIN A1C: Hgb A1c MFr Bld: 5.3 % (ref 4.6–6.5)

## 2017-04-14 LAB — LIPID PANEL
Cholesterol: 193 mg/dL (ref 0–200)
HDL: 42.9 mg/dL
LDL Cholesterol: 130 mg/dL — ABNORMAL HIGH (ref 0–99)
NonHDL: 150.42
Total CHOL/HDL Ratio: 5
Triglycerides: 102 mg/dL (ref 0.0–149.0)
VLDL: 20.4 mg/dL (ref 0.0–40.0)

## 2017-04-14 LAB — URINALYSIS, ROUTINE W REFLEX MICROSCOPIC
Bilirubin Urine: NEGATIVE
HGB URINE DIPSTICK: NEGATIVE
Ketones, ur: NEGATIVE
Leukocytes, UA: NEGATIVE
NITRITE: NEGATIVE
RBC / HPF: NONE SEEN (ref 0–?)
SPECIFIC GRAVITY, URINE: 1.015 (ref 1.000–1.030)
Total Protein, Urine: NEGATIVE
Urine Glucose: NEGATIVE
Urobilinogen, UA: 0.2 (ref 0.0–1.0)
pH: 6.5 (ref 5.0–8.0)

## 2017-04-14 LAB — TSH: TSH: 1.97 u[IU]/mL (ref 0.35–4.50)

## 2017-04-14 LAB — HM PAP SMEAR

## 2017-04-14 NOTE — Patient Instructions (Signed)

## 2017-04-14 NOTE — Progress Notes (Signed)
Subjective:  Patient ID: Holly Fowler, female    DOB: 09-Jun-1956  Age: 61 y.o. MRN: [id number]  CC: Annual Exam and Hypertension   HPI Holly Fowler presents for a CPX.  Her BP has been well controlled. She feels well and offers no complaints.  Outpatient Medications Prior to Visit  Medication Sig Dispense Refill  . chlorthalidone (HYGROTON) 25 MG tablet TAKE 1 TABLET BY MOUTH ONCE DAILY. 90 tablet 0  . levocetirizine (XYZAL) 5 MG tablet Take 1 tablet (5 mg total) by mouth every evening. 90 tablet 3  . triamcinolone (NASACORT AQ) 55 MCG/ACT AERO nasal inhaler Place 2 sprays into the nose 2 (two) times daily. 3 Inhaler 3  . triamcinolone cream (KENALOG) 0.5 % Apply topically 2 (two) times daily as needed. Use sparingly as this medication can bleach your skin. 30 g 3  . verapamil (CALAN-SR) 240 MG CR tablet Take 1 tablet (240 mg total) by mouth at bedtime. 90 tablet 1   No facility-administered medications prior to visit.     ROS Review of Systems  Constitutional: Negative.  Negative for appetite change, diaphoresis, fatigue and unexpected weight change.  HENT: Negative.   Eyes: Negative.   Respiratory: Negative.  Negative for apnea, cough, chest tightness, shortness of breath and wheezing.   Cardiovascular: Negative for chest pain, palpitations and leg swelling.  Gastrointestinal: Negative for abdominal pain, blood in stool, constipation, diarrhea, nausea and vomiting.  Endocrine: Negative.   Genitourinary: Negative.  Negative for decreased urine volume, difficulty urinating, dysuria, genital sores, menstrual problem, pelvic pain, urgency, vaginal bleeding and vaginal discharge.  Musculoskeletal: Negative.  Negative for arthralgias and myalgias.  Skin: Negative.   Allergic/Immunologic: Negative.   Neurological: Negative.  Negative for dizziness.  Hematological: Negative for adenopathy. Does not bruise/bleed easily.  Psychiatric/Behavioral: Negative.     Objective:  BP  120/70 (BP Location: Left Arm, Patient Position: Sitting, Cuff Size: Large)   Pulse 72   Temp 97.6 F (36.4 C) (Oral)   Resp 16   Ht 5\' 3"  (1.6 m)   Wt 221 lb 4 oz (100.4 kg)   SpO2 99%   BMI 39.19 kg/m   BP Readings from Last 3 Encounters:  04/14/17 120/70  10/20/16 130/80  03/13/16 118/74    Wt Readings from Last 3 Encounters:  04/14/17 221 lb 4 oz (100.4 kg)  10/20/16 222 lb (100.7 kg)  03/13/16 219 lb 6 oz (99.5 kg)    Physical Exam  Constitutional: She is oriented to person, place, and time. No distress.  HENT:  Mouth/Throat: Oropharynx is clear and moist. No oropharyngeal exudate.  Eyes: Conjunctivae are normal. Right eye exhibits no discharge. Left eye exhibits no discharge. No scleral icterus.  Neck: Normal range of motion. Neck supple. No JVD present. No tracheal deviation present. No thyromegaly present.  Cardiovascular: Normal rate, regular rhythm and intact distal pulses.  Exam reveals no gallop and no friction rub.   No murmur heard. EKG-  Sinus  Rhythm  Low voltage -consistent with large breasts.   ABNORMAL - no old EKG for comparison   Pulmonary/Chest: Effort normal and breath sounds normal. No respiratory distress. She has no wheezes. She has no rales. She exhibits no tenderness.  Abdominal: Soft. Bowel sounds are normal. She exhibits no distension and no mass. There is no tenderness. There is no rebound and no guarding. Hernia confirmed negative in the right inguinal area and confirmed negative in the left inguinal area.  Genitourinary: Rectum normal, vagina normal  and uterus normal. Rectal exam shows no external hemorrhoid, no internal hemorrhoid, no fissure, no mass, anal tone normal and guaiac negative stool. No breast swelling, tenderness, discharge or bleeding. Pelvic exam was performed with patient supine. No labial fusion. There is no rash, tenderness, lesion or injury on the right labia. There is no rash, tenderness, lesion or injury on the left labia.  Uterus is not deviated, not enlarged, not fixed and not tender. Cervix exhibits no motion tenderness, no discharge and no friability. Right adnexum displays no mass, no tenderness and no fullness. Left adnexum displays no mass, no tenderness and no fullness. No erythema, tenderness or bleeding in the vagina. No foreign body in the vagina. No signs of injury around the vagina. No vaginal discharge found.  Musculoskeletal: Normal range of motion. She exhibits no edema, tenderness or deformity.  Lymphadenopathy:    She has no cervical adenopathy.       Right: No inguinal adenopathy present.       Left: No inguinal adenopathy present.  Neurological: She is alert and oriented to person, place, and time.  Skin: Skin is warm and dry. She is not diaphoretic. No erythema. No pallor.  Psychiatric: She has a normal mood and affect. Her behavior is normal. Judgment and thought content normal.  Vitals reviewed.   Lab Results  Component Value Date   WBC 13.9 (H) 04/14/2017   HGB 13.2 04/14/2017   HCT 41.3 04/14/2017   PLT 371.0 04/14/2017   GLUCOSE 111 (H) 04/14/2017   CHOL 193 04/14/2017   TRIG 102.0 04/14/2017   HDL 42.90 04/14/2017   LDLCALC 130 (H) 04/14/2017   ALT 14 04/14/2017   AST 14 04/14/2017   NA 138 04/14/2017   K 4.2 04/14/2017   CL 102 04/14/2017   CREATININE 1.28 (H) 04/14/2017   BUN 17 04/14/2017   CO2 29 04/14/2017   TSH 1.97 04/14/2017   HGBA1C 5.3 04/14/2017    No results found.  Assessment & Plan:   Yuriko was seen today for annual exam and hypertension.  Diagnoses and all orders for this visit:  Essential hypertension- Her blood pressure is adequately well controlled. Electrolytes are normal. Renal function is stable.  -     Comprehensive metabolic panel; Future -     Urinalysis, Routine w reflex microscopic; Future -     EKG 12-Lead  Kidney disease, chronic, stage III (GFR 30-59 ml/min)- her renal function is stable. She will continue to avoid nephrotoxic  agents. Will continue to maintain good blood pressure control. -     Comprehensive metabolic panel; Future -     Urinalysis, Routine w reflex microscopic; Future  Hyperglycemia- improvement noted -     Comprehensive metabolic panel; Future -     Hemoglobin A1c; Future  Hyperlipidemia with target LDL less than 100- she has a slightly elevated risk. I've asked her to consider starting a statin and aspirin. -     TSH; Future  Leukocytosis, unspecified type- her white cell count remains mildly elevated. She is not anemic and her other cell lines are normal. This is most consistent with her history of tobacco abuse. Will continue to follow this and observe for the development of possible lymphoproliferative disease. -     CBC with Differential/Platelet; Future  Routine general medical examination at a health care facility- exam completed, labs reviewed, vaccines reviewed and updated, Pap smear completed, she was referred for a mammogram. Screening for colon cancer is up-to-date, patient education material was given. -  Lipid panel; Future  Visit for screening mammogram -     MM DIGITAL SCREENING BILATERAL; Future  Cervical cancer screening -     Cytology - PAP; Future   I am having Ms. Nardelli maintain her triamcinolone cream, levocetirizine, triamcinolone, verapamil, and chlorthalidone.  No orders of the defined types were placed in this encounter.    Follow-up: Return in about 6 months (around 10/15/2017).  [name], MD

## 2017-04-15 LAB — CYTOLOGY - PAP
Adequacy: ABSENT
Diagnosis: NEGATIVE
HPV (WINDOPATH): NOT DETECTED

## 2017-05-01 ENCOUNTER — Other Ambulatory Visit: Payer: Self-pay | Admitting: Internal Medicine

## 2017-05-01 DIAGNOSIS — I10 Essential (primary) hypertension: Secondary | ICD-10-CM

## 2017-05-01 DIAGNOSIS — N183 Chronic kidney disease, stage 3 unspecified: Secondary | ICD-10-CM

## 2017-06-16 ENCOUNTER — Other Ambulatory Visit: Payer: Self-pay | Admitting: Internal Medicine

## 2017-06-16 DIAGNOSIS — L309 Dermatitis, unspecified: Secondary | ICD-10-CM

## 2017-06-17 ENCOUNTER — Ambulatory Visit
Admission: RE | Admit: 2017-06-17 | Discharge: 2017-06-17 | Disposition: A | Discharge status: Home / Self Care | Payer: BC Managed Care – PPO | Source: Ambulatory Visit | Attending: Internal Medicine | Admitting: Internal Medicine

## 2017-06-17 DIAGNOSIS — Z1231 Encounter for screening mammogram for malignant neoplasm of breast: Secondary | ICD-10-CM

## 2017-06-17 LAB — HM MAMMOGRAPHY

## 2017-08-13 ENCOUNTER — Other Ambulatory Visit (INDEPENDENT_AMBULATORY_CARE_PROVIDER_SITE_OTHER): Payer: BC Managed Care – PPO

## 2017-08-13 ENCOUNTER — Ambulatory Visit (INDEPENDENT_AMBULATORY_CARE_PROVIDER_SITE_OTHER)
Admission: RE | Admit: 2017-08-13 | Discharge: 2017-08-13 | Disposition: A | Discharge status: Home / Self Care | Payer: BC Managed Care – PPO | Source: Ambulatory Visit | Attending: Internal Medicine | Admitting: Internal Medicine

## 2017-08-13 ENCOUNTER — Encounter: Payer: Self-pay | Admitting: Internal Medicine

## 2017-08-13 ENCOUNTER — Ambulatory Visit: Payer: BC Managed Care – PPO | Admitting: Internal Medicine

## 2017-08-13 VITALS — BP 130/90 | HR 84 | Temp 97.9°F | Resp 16 | Ht 63.0 in | Wt 217.0 lb

## 2017-08-13 DIAGNOSIS — M25551 Pain in right hip: Secondary | ICD-10-CM

## 2017-08-13 DIAGNOSIS — Z23 Encounter for immunization: Secondary | ICD-10-CM | POA: Diagnosis not present

## 2017-08-13 DIAGNOSIS — R1031 Right lower quadrant pain: Secondary | ICD-10-CM | POA: Diagnosis not present

## 2017-08-13 DIAGNOSIS — R10813 Right lower quadrant abdominal tenderness: Secondary | ICD-10-CM

## 2017-08-13 DIAGNOSIS — G8929 Other chronic pain: Secondary | ICD-10-CM

## 2017-08-13 LAB — COMPREHENSIVE METABOLIC PANEL
ALBUMIN: 4.3 g/dL (ref 3.5–5.2)
ALT: 11 U/L (ref 0–35)
AST: 11 U/L (ref 0–37)
Alkaline Phosphatase: 149 U/L — ABNORMAL HIGH (ref 39–117)
BUN: 20 mg/dL (ref 6–23)
CHLORIDE: 101 meq/L (ref 96–112)
CO2: 28 meq/L (ref 19–32)
Calcium: 9.7 mg/dL (ref 8.4–10.5)
Creatinine, Ser: 1.36 mg/dL — ABNORMAL HIGH (ref 0.40–1.20)
GFR: 50.78 mL/min — ABNORMAL LOW (ref >60.00)
Glucose, Bld: 111 mg/dL — ABNORMAL HIGH (ref 70–99)
POTASSIUM: 4.3 meq/L (ref 3.5–5.1)
SODIUM: 136 meq/L (ref 135–145)
Total Bilirubin: 0.6 mg/dL (ref 0.2–1.2)
Total Protein: 8 g/dL (ref 6.0–8.3)

## 2017-08-13 LAB — URINALYSIS, ROUTINE W REFLEX MICROSCOPIC
HGB URINE DIPSTICK: NEGATIVE
Ketones, ur: NEGATIVE
LEUKOCYTES UA: NEGATIVE
NITRITE: NEGATIVE
Specific Gravity, Urine: 1.02 (ref 1.000–1.030)
Total Protein, Urine: NEGATIVE
Urine Glucose: NEGATIVE
Urobilinogen, UA: 1 (ref 0.0–1.0)
pH: 6.5 (ref 5.0–8.0)

## 2017-08-13 LAB — CBC WITH DIFFERENTIAL/PLATELET
BASOS ABS: 0.1 10*3/uL (ref 0.0–0.1)
Basophils Relative: 0.8 % (ref 0.0–3.0)
EOS ABS: 0.1 10*3/uL (ref 0.0–0.7)
Eosinophils Relative: 0.5 % (ref 0.0–5.0)
HEMATOCRIT: 42.6 % (ref 36.0–46.0)
Hemoglobin: 13.5 g/dL (ref 12.0–15.0)
LYMPHS PCT: 13.8 % (ref 12.0–46.0)
Lymphs Abs: 2.1 10*3/uL (ref 0.7–4.0)
MCHC: 31.7 g/dL (ref 30.0–36.0)
MCV: 81.8 fl (ref 78.0–100.0)
MONOS PCT: 6.7 % (ref 3.0–12.0)
Monocytes Absolute: 1 10*3/uL (ref 0.1–1.0)
Neutro Abs: 11.7 10*3/uL — ABNORMAL HIGH (ref 1.4–7.7)
Neutrophils Relative %: 78.2 % — ABNORMAL HIGH (ref 43.0–77.0)
PLATELETS: 413 10*3/uL — AB (ref 150.0–400.0)
RBC: 5.2 Mil/uL — AB (ref 3.87–5.11)
RDW: 14.1 % (ref 11.5–15.5)
WBC: 14.9 10*3/uL — ABNORMAL HIGH (ref 4.0–10.5)

## 2017-08-13 NOTE — Progress Notes (Signed)
Subjective:  Patient ID: Holly Fowler, female    DOB: 07/28/1956  Age: 61 y.o. MRN: [id number]  CC: Abdominal Pain   HPI Holly Fowler presents for concerns about a one-month history of pain in her right groin, right hip, and right buttock.  Holly Fowler describes it as an annoying and nagging sensation that increases when Holly Fowler walks and decreases when Holly Fowler sits down and rests.  The pain radiates into her right upper thigh and Holly Fowler describes it as an achy sensation in her right groin.  Outpatient Medications Prior to Visit  Medication Sig Dispense Refill  . chlorthalidone (HYGROTON) 25 MG tablet TAKE 1 TABLET BY MOUTH ONCE DAILY. 90 tablet 0  . levocetirizine (XYZAL) 5 MG tablet Take 1 tablet (5 mg total) by mouth every evening. 90 tablet 3  . triamcinolone (NASACORT AQ) 55 MCG/ACT AERO nasal inhaler Place 2 sprays into the nose 2 (two) times daily. 3 Inhaler 3  . triamcinolone cream (KENALOG) 0.5 % APPLY TWICE DAILY TO SKIN AS NEEDED; APPLY SPARINGLY AS THIS MEDICATION CAN BLEACH YOUR SKIN 15 g 3  . verapamil (CALAN-SR) 240 MG CR tablet TAKE 1 TABLET BY MOUTH ONCE DAILY AT BEDTIME 90 tablet 1   No facility-administered medications prior to visit.     ROS Review of Systems  Constitutional: Negative.  Negative for chills, fatigue and fever.  HENT: Negative.  Negative for trouble swallowing.   Eyes: Negative.  Negative for visual disturbance.  Respiratory: Negative for cough, chest tightness, shortness of breath and wheezing.   Cardiovascular: Negative for chest pain, palpitations and leg swelling.  Gastrointestinal: Positive for abdominal pain. Negative for abdominal distention, constipation, diarrhea, nausea and vomiting.  Endocrine: Negative.   Genitourinary: Negative.  Negative for decreased urine volume, difficulty urinating, dysuria, flank pain, frequency, hematuria, urgency, vaginal discharge and vaginal pain.  Musculoskeletal: Positive for arthralgias. Negative for back pain and gait  problem.  Skin: Negative.  Negative for color change and rash.  Allergic/Immunologic: Negative.   Neurological: Negative.  Negative for dizziness, weakness, numbness and headaches.  Hematological: Negative for adenopathy. Does not bruise/bleed easily.  Psychiatric/Behavioral: Negative.     Objective:  BP 130/90 (BP Location: Left Arm, Patient Position: Sitting, Cuff Size: Large)   Pulse 84   Temp 97.9 F (36.6 C) (Oral)   Resp 16   Ht 5\' 3"  (1.6 m)   Wt 217 lb (98.4 kg)   SpO2 98%   BMI 38.44 kg/m   BP Readings from Last 3 Encounters:  08/13/17 130/90  04/14/17 120/70  10/20/16 130/80    Wt Readings from Last 3 Encounters:  08/13/17 217 lb (98.4 kg)  04/14/17 221 lb 4 oz (100.4 kg)  10/20/16 222 lb (100.7 kg)    Physical Exam  Constitutional: Holly Fowler is oriented to person, place, and time.  Non-toxic appearance. Holly Fowler does not have a sickly appearance. Holly Fowler does not appear ill. No distress.  HENT:  Mouth/Throat: Oropharynx is clear and moist. No oropharyngeal exudate.  Eyes: Conjunctivae are normal. Right eye exhibits no discharge. Left eye exhibits no discharge. No scleral icterus.  Neck: Normal range of motion. Neck supple. No JVD present. No thyromegaly present.  Cardiovascular: Normal rate, regular rhythm and normal heart sounds. Exam reveals no gallop.  No murmur heard. Pulmonary/Chest: Effort normal and breath sounds normal. No respiratory distress. Holly Fowler has no wheezes. Holly Fowler has no rales.  Abdominal: Soft. Bowel sounds are normal. Holly Fowler exhibits no distension and no mass. There is no hepatosplenomegaly. There  is tenderness in the right lower quadrant. There is no rigidity, no rebound, no guarding, no CVA tenderness, no tenderness at McBurney's point and negative Murphy's sign.    Musculoskeletal: Normal range of motion. Holly Fowler exhibits no edema, tenderness or deformity.       Right hip: Normal. Holly Fowler exhibits normal range of motion, normal strength, no tenderness, no bony  tenderness, no swelling, no crepitus, no deformity and no laceration.  Lymphadenopathy:    Holly Fowler has no cervical adenopathy.  Neurological: Holly Fowler is alert and oriented to person, place, and time.  Skin: Skin is warm and dry. No rash noted. Holly Fowler is not diaphoretic. No erythema. No pallor.  Vitals reviewed.   Lab Results  Component Value Date   WBC 14.9 (H) 08/13/2017   HGB 13.5 08/13/2017   HCT 42.6 08/13/2017   PLT 413.0 (H) 08/13/2017   GLUCOSE 111 (H) 08/13/2017   CHOL 193 04/14/2017   TRIG 102.0 04/14/2017   HDL 42.90 04/14/2017   LDLCALC 130 (H) 04/14/2017   ALT 11 08/13/2017   AST 11 08/13/2017   NA 136 08/13/2017   K 4.3 08/13/2017   CL 101 08/13/2017   CREATININE 1.36 (H) 08/13/2017   BUN 20 08/13/2017   CO2 28 08/13/2017   TSH 1.97 04/14/2017   HGBA1C 5.3 04/14/2017    Mm Digital Screening Bilateral  Result Date: 06/17/2017 CLINICAL DATA:  Screening. EXAM: DIGITAL SCREENING BILATERAL MAMMOGRAM WITH CAD COMPARISON:  Previous exam(s). ACR Breast Density Category b: There are scattered areas of fibroglandular density. FINDINGS: There are no findings suspicious for malignancy. Images were processed with CAD. IMPRESSION: No mammographic evidence of malignancy. A result letter of this screening mammogram will be mailed directly to the patient. RECOMMENDATION: Screening mammogram in one year. (Code:SM-B-01Y) BI-RADS CATEGORY  1: Negative. Electronically Signed   By: Lajean Manes M.D.   On: 06/17/2017 08:36   Dg Hip Unilat With Pelvis 2-3 Views Right  Result Date: 08/13/2017 CLINICAL DATA:  One month of groin pain.  No known injury. EXAM: DG HIP (WITH OR WITHOUT PELVIS) 2-3V RIGHT COMPARISON:  None in PACs FINDINGS: AP and lateral views of the right hip reveal the bones to be subjectively adequately mineralized. There is mild-to-moderate narrowing of the right hip joint. There is some scleroses of the subarticular bone of the roof of the acetabulum. There is no acute or healing  fracture. The femoral neck, intertrochanteric, and subtrochanteric regions are normal. The observed portions of the right hemipelvis are normal. IMPRESSION: There is moderate degenerative change of the right hip. There is no acute bony abnormality. Electronically Signed   By: David  Martinique M.D.   On: 08/13/2017 15:10     Assessment & Plan:   Holly Fowler was seen today for abdominal pain.  Diagnoses and all orders for this visit:  Need for influenza vaccination -     Flu Vaccine QUAD 36+ mos IM  Chronic pain of right hip- plain film shows moderate degenerative joint disease.  Holly Fowler cannot take NSAIDs due to renal impairment.  Will try to control her pain with hydrocodone and acetaminophen. -     DG HIP UNILAT WITH PELVIS 2-3 VIEWS RIGHT; Future -     HYDROcodone-acetaminophen (NORCO/VICODIN) 5-325 MG tablet; Take 1 tablet by mouth every 6 (six) hours as needed for moderate pain.  Right lower quadrant abdominal tenderness without rebound tenderness -Holly Fowler has tenderness to palpation but no rebound or guarding and a mildly elevated white count.  Her urinalysis is normal.  The white count may simply be related to tobacco abuse but in light of these findings I have asked her to undergo a CT of the abdomen and pelvis with contrast to screen for occult abscess, malignancy in the fallopian tube and ovary, chronic appendicitis, and occult hernia. -     Comprehensive metabolic panel; Future -     CBC with Differential/Platelet; Future -     Urinalysis, Routine w reflex microscopic; Future -     CT Abdomen Pelvis W Contrast; Future  Right lower quadrant abdominal pain -     CT Abdomen Pelvis W Contrast; Future   I am having Holly Fowler start on HYDROcodone-acetaminophen. I am also having her maintain her levocetirizine, triamcinolone, chlorthalidone, verapamil, and triamcinolone cream.  Meds ordered this encounter  Medications  . HYDROcodone-acetaminophen (NORCO/VICODIN) 5-325 MG tablet    Sig: Take 1  tablet by mouth every 6 (six) hours as needed for moderate pain.    Dispense:  40 tablet    Refill:  0     Follow-up: Return in about 4 weeks (around 09/10/2017).  [name], MD

## 2017-08-13 NOTE — Patient Instructions (Signed)

## 2017-08-15 ENCOUNTER — Encounter: Payer: Self-pay | Admitting: Internal Medicine

## 2017-08-17 MED ORDER — HYDROCODONE-ACETAMINOPHEN 5-325 MG PO TABS: 1.0000 | ORAL_TABLET | Freq: Four times a day (QID) | ORAL | 0 refills | Status: DC | PRN

## 2017-10-19 ENCOUNTER — Encounter: Payer: Self-pay | Admitting: Internal Medicine

## 2017-10-19 ENCOUNTER — Other Ambulatory Visit (INDEPENDENT_AMBULATORY_CARE_PROVIDER_SITE_OTHER): Payer: BC Managed Care – PPO

## 2017-10-19 ENCOUNTER — Ambulatory Visit: Payer: BC Managed Care – PPO | Admitting: Internal Medicine

## 2017-10-19 VITALS — BP 130/74 | HR 77 | Temp 97.6°F | Resp 16 | Ht 63.0 in | Wt 212.8 lb

## 2017-10-19 DIAGNOSIS — I10 Essential (primary) hypertension: Secondary | ICD-10-CM

## 2017-10-19 DIAGNOSIS — G8929 Other chronic pain: Secondary | ICD-10-CM | POA: Diagnosis not present

## 2017-10-19 DIAGNOSIS — N183 Chronic kidney disease, stage 3 unspecified: Secondary | ICD-10-CM

## 2017-10-19 DIAGNOSIS — R739 Hyperglycemia, unspecified: Secondary | ICD-10-CM

## 2017-10-19 DIAGNOSIS — E785 Hyperlipidemia, unspecified: Secondary | ICD-10-CM

## 2017-10-19 DIAGNOSIS — M25551 Pain in right hip: Secondary | ICD-10-CM | POA: Diagnosis not present

## 2017-10-19 DIAGNOSIS — D72829 Elevated white blood cell count, unspecified: Secondary | ICD-10-CM

## 2017-10-19 LAB — CBC WITH DIFFERENTIAL/PLATELET
Basophils Absolute: 0.1 10*3/uL (ref 0.0–0.1)
Basophils Relative: 0.3 % (ref 0.0–3.0)
EOS PCT: 0.6 % (ref 0.0–5.0)
Eosinophils Absolute: 0.1 10*3/uL (ref 0.0–0.7)
HCT: 40.8 % (ref 36.0–46.0)
Hemoglobin: 13.3 g/dL (ref 12.0–15.0)
LYMPHS ABS: 3 10*3/uL (ref 0.7–4.0)
Lymphocytes Relative: 20.1 % (ref 12.0–46.0)
MCHC: 32.7 g/dL (ref 30.0–36.0)
MCV: 80.1 fl (ref 78.0–100.0)
MONOS PCT: 7.5 % (ref 3.0–12.0)
Monocytes Absolute: 1.1 10*3/uL — ABNORMAL HIGH (ref 0.1–1.0)
NEUTROS ABS: 10.5 10*3/uL — AB (ref 1.4–7.7)
NEUTROS PCT: 71.5 % (ref 43.0–77.0)
PLATELETS: 412 10*3/uL — AB (ref 150.0–400.0)
RBC: 5.09 Mil/uL (ref 3.87–5.11)
RDW: 14.1 % (ref 11.5–15.5)
WBC: 14.7 10*3/uL — ABNORMAL HIGH (ref 4.0–10.5)

## 2017-10-19 LAB — LIPID PANEL
Cholesterol: 206 mg/dL — ABNORMAL HIGH (ref 0–200)
HDL: 43.1 mg/dL (ref >39.00)
LDL Cholesterol: 138 mg/dL — ABNORMAL HIGH (ref 0–99)
NonHDL: 162.79
TRIGLYCERIDES: 124 mg/dL (ref 0.0–149.0)
Total CHOL/HDL Ratio: 5
VLDL: 24.8 mg/dL (ref 0.0–40.0)

## 2017-10-19 LAB — BASIC METABOLIC PANEL
BUN: 22 mg/dL (ref 6–23)
CALCIUM: 10.1 mg/dL (ref 8.4–10.5)
CO2: 28 mEq/L (ref 19–32)
Chloride: 100 mEq/L (ref 96–112)
Creatinine, Ser: 1.35 mg/dL — ABNORMAL HIGH (ref 0.40–1.20)
GFR: 51.18 mL/min — AB (ref >60.00)
Glucose, Bld: 115 mg/dL — ABNORMAL HIGH (ref 70–99)
Potassium: 4.3 mEq/L (ref 3.5–5.1)
Sodium: 137 mEq/L (ref 135–145)

## 2017-10-19 MED ORDER — ROSUVASTATIN CALCIUM 20 MG PO TABS: 20.0000 mg | ORAL_TABLET | Freq: Every day | ORAL | 1 refills | Status: DC

## 2017-10-19 MED ORDER — VERAPAMIL HCL ER 240 MG PO TBCR: 240.0000 mg | EXTENDED_RELEASE_TABLET | Freq: Every day | ORAL | 1 refills | Status: DC

## 2017-10-19 MED ORDER — CHLORTHALIDONE 25 MG PO TABS: 25.0000 mg | ORAL_TABLET | Freq: Every day | ORAL | 1 refills | Status: DC

## 2017-10-19 NOTE — Patient Instructions (Signed)
It was nice to see you today!  Please schedule a Follow Up with me in 6 months.  Please do go to the lab. Please do not go the xray.   If applicable, Your medications have been sent to your pharmacy.   If a referral was needed today, I have entered this for you. They will call you directly within the next 2 weeks to schedule your appointment.   Results will be sent to your mychart if you have activated your account.  Continue working on staying active and healthy.   There are no preventive care reminders to display for this patient.

## 2017-10-19 NOTE — Progress Notes (Signed)
Subjective:  Patient ID: Holly Fowler, female    DOB: 1956/08/22  Age: 62 y.o. MRN: [id number]  CC: Hypertension   HPI Holly Fowler presents for f/up - she tells me that the right hip pain is getting much better.  She decided not to do the CT scan.  She is doing some exercises to relieve the pain.  She tells me her blood pressure has been well controlled and she denies any recent episodes of headache/blurred vision/chest pain/shortness of breath/edema/fatigue.  Outpatient Medications Prior to Visit  Medication Sig Dispense Refill  . levocetirizine (XYZAL) 5 MG tablet Take 1 tablet (5 mg total) by mouth every evening. 90 tablet 3  . triamcinolone (NASACORT AQ) 55 MCG/ACT AERO nasal inhaler Place 2 sprays into the nose 2 (two) times daily. 3 Inhaler 3  . triamcinolone cream (KENALOG) 0.5 % APPLY TWICE DAILY TO SKIN AS NEEDED; APPLY SPARINGLY AS THIS MEDICATION CAN BLEACH YOUR SKIN 15 g 3  . chlorthalidone (HYGROTON) 25 MG tablet TAKE 1 TABLET BY MOUTH ONCE DAILY. 90 tablet 0  . verapamil (CALAN-SR) 240 MG CR tablet TAKE 1 TABLET BY MOUTH ONCE DAILY AT BEDTIME 90 tablet 1  . HYDROcodone-acetaminophen (NORCO/VICODIN) 5-325 MG tablet Take 1 tablet by mouth every 6 (six) hours as needed for moderate pain. 40 tablet 0   No facility-administered medications prior to visit.     ROS Review of Systems  Constitutional: Negative.  Negative for diaphoresis, fatigue and unexpected weight change.  HENT: Negative.  Negative for sore throat and trouble swallowing.   Eyes: Negative for visual disturbance.  Respiratory: Negative for cough, chest tightness and wheezing.   Cardiovascular: Negative.  Negative for chest pain, palpitations and leg swelling.  Gastrointestinal: Negative for abdominal pain, constipation, diarrhea, nausea and vomiting.  Endocrine: Negative.   Genitourinary: Negative.  Negative for difficulty urinating.  Musculoskeletal: Positive for arthralgias. Negative for myalgias and neck  pain.  Skin: Negative.  Negative for color change.  Allergic/Immunologic: Negative.   Neurological: Negative.  Negative for dizziness, weakness, light-headedness, numbness and headaches.  Hematological: Negative for adenopathy. Does not bruise/bleed easily.  Psychiatric/Behavioral: Negative.     Objective:  BP 130/74 (BP Location: Left Arm, Patient Position: Sitting, Cuff Size: Large)   Pulse 77   Temp 97.6 F (36.4 C) (Oral)   Resp 16   Ht 5\' 3"  (1.6 m)   Wt 212 lb 12 oz (96.5 kg)   SpO2 99%   BMI 37.69 kg/m   BP Readings from Last 3 Encounters:  10/19/17 130/74  08/13/17 130/90  04/14/17 120/70    Wt Readings from Last 3 Encounters:  10/19/17 212 lb 12 oz (96.5 kg)  08/13/17 217 lb (98.4 kg)  04/14/17 221 lb 4 oz (100.4 kg)    Physical Exam  Constitutional: She is oriented to person, place, and time. No distress.  HENT:  Mouth/Throat: Oropharynx is clear and moist. No oropharyngeal exudate.  Eyes: Conjunctivae are normal. Left eye exhibits no discharge. No scleral icterus.  Neck: Normal range of motion. Neck supple. No JVD present. No thyromegaly present.  Cardiovascular: Normal rate, regular rhythm and normal heart sounds. Exam reveals no gallop.  No murmur heard. Pulmonary/Chest: Effort normal and breath sounds normal. No respiratory distress. She has no wheezes. She has no rales.  Abdominal: Soft. Bowel sounds are normal. She exhibits no distension and no mass. There is no tenderness. There is no rebound and no guarding.  Musculoskeletal: Normal range of motion. She exhibits no  edema, tenderness or deformity.  Lymphadenopathy:    She has no cervical adenopathy.  Neurological: She is alert and oriented to person, place, and time.  Skin: Skin is warm and dry. No rash noted. She is not diaphoretic. No erythema. No pallor.  Vitals reviewed.   Lab Results  Component Value Date   WBC 14.7 (H) 10/19/2017   HGB 13.3 10/19/2017   HCT 40.8 10/19/2017   PLT 412.0 (H)  10/19/2017   GLUCOSE 115 (H) 10/19/2017   CHOL 206 (H) 10/19/2017   TRIG 124.0 10/19/2017   HDL 43.10 10/19/2017   LDLCALC 138 (H) 10/19/2017   ALT 11 08/13/2017   AST 11 08/13/2017   NA 137 10/19/2017   K 4.3 10/19/2017   CL 100 10/19/2017   CREATININE 1.35 (H) 10/19/2017   BUN 22 10/19/2017   CO2 28 10/19/2017   TSH 1.97 04/14/2017   HGBA1C 5.3 04/14/2017    Dg Hip Unilat With Pelvis 2-3 Views Right  Result Date: 08/13/2017 CLINICAL DATA:  One month of groin pain.  No known injury. EXAM: DG HIP (WITH OR WITHOUT PELVIS) 2-3V RIGHT COMPARISON:  None in PACs FINDINGS: AP and lateral views of the right hip reveal the bones to be subjectively adequately mineralized. There is mild-to-moderate narrowing of the right hip joint. There is some scleroses of the subarticular bone of the roof of the acetabulum. There is no acute or healing fracture. The femoral neck, intertrochanteric, and subtrochanteric regions are normal. The observed portions of the right hemipelvis are normal. IMPRESSION: There is moderate degenerative change of the right hip. There is no acute bony abnormality. Electronically Signed   By: David  Martinique M.D.   On: 08/13/2017 15:10    Assessment & Plan:   Holly Fowler was seen today for hypertension.  Diagnoses and all orders for this visit:  Hyperlipidemia with target LDL less than 100- She has an elevated ASCVD risk score so I have asked her to start taking a statin for CV risk reduction. -     Lipid panel; Future -     rosuvastatin (CRESTOR) 20 MG tablet; Take 1 tablet (20 mg total) by mouth daily.  Essential hypertension- Her blood pressure is well controlled, electrolytes and renal function are normal. -     verapamil (CALAN-SR) 240 MG CR tablet; Take 1 tablet (240 mg total) by mouth at bedtime. -     chlorthalidone (HYGROTON) 25 MG tablet; Take 1 tablet (25 mg total) by mouth daily. -     Basic metabolic panel; Future  Hyperglycemia- improvement noted -     Basic  metabolic panel; Future  Leukocytosis, unspecified type- Her WBC remains mildly elevated but the other cell lines are normal.  She is not anemic.  This most consistent with tobacco abuse.  She was asked to quit smoking. -     CBC with Differential/Platelet; Future  Chronic pain of right hip  Kidney disease, chronic, stage III (GFR 30-59 ml/min) ([place])- Her renal function is stable.  Will continue to maintain good blood pressure control.  She agrees to avoid nephrotoxic agents. -     Basic metabolic panel; Future   I have discontinued Tyler Pita. Kozinski's HYDROcodone-acetaminophen. I have also changed her verapamil and chlorthalidone. Additionally, I am having her start on rosuvastatin. Lastly, I am having her maintain her levocetirizine, triamcinolone, and triamcinolone cream.  Meds ordered this encounter  Medications  . verapamil (CALAN-SR) 240 MG CR tablet    Sig: Take 1 tablet (240 mg  total) by mouth at bedtime.    Dispense:  90 tablet    Refill:  1  . chlorthalidone (HYGROTON) 25 MG tablet    Sig: Take 1 tablet (25 mg total) by mouth daily.    Dispense:  90 tablet    Refill:  1  . rosuvastatin (CRESTOR) 20 MG tablet    Sig: Take 1 tablet (20 mg total) by mouth daily.    Dispense:  90 tablet    Refill:  1     Follow-up: Return in about 6 months (around 04/18/2018).  [name], MD

## 2017-10-20 ENCOUNTER — Encounter: Payer: Self-pay | Admitting: Internal Medicine

## 2017-10-29 ENCOUNTER — Telehealth: Payer: Self-pay | Admitting: Internal Medicine

## 2017-10-29 NOTE — Telephone Encounter (Signed)
Copied from [place] [phone]. Topic: Quick Communication - See Telephone Encounter >> Oct 29, 2017 10:53 AM Oneta Rack wrote:  Relation to pt: self Call back number: [phone] Pharmacy: [facility], [place] [phone] (Phone) [phone] (Fax)   Reason for call:  Patient states rosuvastatin (CRESTOR) 20 MG tablet causing heart palpations and making her feel nausea, last time patient took medication was last night. Currently not experiencing any symptoms but patient would like to tey an alternate, please advise  >> Oct 29, 2017 11:01 AM Oneta Rack wrote:  Relation to pt: self Call back number: [phone] Pharmacy: [facility], [place] [phone]) [phone] (Fax)   Reason for call:  Patient states rosuvastatin (CRESTOR) 20 MG tablet causing heart palpations and making her feel nausea, last time patient took medication was last night. Currently not experiencing any symptoms but patient would like to try an alternate, please advise

## 2017-11-11 ENCOUNTER — Other Ambulatory Visit: Payer: Self-pay | Admitting: Internal Medicine

## 2017-11-11 DIAGNOSIS — E785 Hyperlipidemia, unspecified: Secondary | ICD-10-CM

## 2017-11-11 MED ORDER — PITAVASTATIN CALCIUM 2 MG PO TABS: 1.0000 | ORAL_TABLET | Freq: Every day | ORAL | 1 refills | Status: DC

## 2017-11-11 NOTE — Telephone Encounter (Signed)
Holly Fowler, try Holly Fowler sent

## 2017-11-11 NOTE — Telephone Encounter (Signed)
Stop crestor and let me know what happens to the symptoms in 1 week

## 2017-11-11 NOTE — Telephone Encounter (Signed)
Patient advised, she will try new rx

## 2017-11-11 NOTE — Telephone Encounter (Signed)
Routing back to dr jones---patient stopped crestor on march 7th and she currently has no symptoms

## 2017-11-11 NOTE — Telephone Encounter (Signed)
Pt calling to check on what she should do for the Crestor.

## 2017-11-11 NOTE — Telephone Encounter (Signed)
Routing to dr jones---is there another cholesterol medication you would like patient to start?---please advise, I will call patient back, thanks

## 2017-11-17 ENCOUNTER — Telehealth: Payer: Self-pay

## 2017-11-17 NOTE — Telephone Encounter (Signed)
PA was requested by [place].  We have 1st month free saving card here in office.   LVM for pt to pick up savings card.

## 2018-01-27 ENCOUNTER — Telehealth: Payer: Self-pay | Admitting: Internal Medicine

## 2018-01-27 NOTE — Telephone Encounter (Signed)
Copied from [place] [id number]. Topic: Quick Communication - Rx Refill/Question >> Jan 27, 2018  9:14 AM Robina Ade, Helene Kelp D wrote: Medication:levocetirizine (XYZAL) 5 MG tablet  Has the patient contacted their pharmacy? Yes, but it needs a prior authorization (Agent: If no, request that the patient contact the pharmacy for the refill.) (Agent: If yes, when and what did the pharmacy advise?)  Preferred Pharmacy (with phone number or street name): *[facility], [place]: Please be advised that RX refills may take up to 3 business days. We ask that you follow-up with your pharmacy.

## 2018-02-01 NOTE — Telephone Encounter (Signed)
Key: [id number]

## 2018-02-02 NOTE — Telephone Encounter (Signed)
Clarified message with Stefannie. PA for Xyzal was denied but this medication can be purchased over the counter without a prescription. Left message informing patient.

## 2018-02-02 NOTE — Telephone Encounter (Signed)
PA was denied. Will you let pt know same and that rx for xyzal was approved.

## 2018-04-19 ENCOUNTER — Ambulatory Visit: Payer: BC Managed Care – PPO | Admitting: Internal Medicine

## 2018-04-20 ENCOUNTER — Ambulatory Visit (INDEPENDENT_AMBULATORY_CARE_PROVIDER_SITE_OTHER): Payer: BC Managed Care – PPO | Admitting: Internal Medicine

## 2018-04-20 ENCOUNTER — Other Ambulatory Visit (INDEPENDENT_AMBULATORY_CARE_PROVIDER_SITE_OTHER): Payer: BC Managed Care – PPO

## 2018-04-20 ENCOUNTER — Encounter: Payer: Self-pay | Admitting: Internal Medicine

## 2018-04-20 VITALS — BP 130/68 | HR 73 | Temp 98.0°F | Resp 16 | Ht 63.0 in | Wt 212.8 lb

## 2018-04-20 DIAGNOSIS — N183 Chronic kidney disease, stage 3 unspecified: Secondary | ICD-10-CM

## 2018-04-20 DIAGNOSIS — R739 Hyperglycemia, unspecified: Secondary | ICD-10-CM | POA: Diagnosis not present

## 2018-04-20 DIAGNOSIS — L309 Dermatitis, unspecified: Secondary | ICD-10-CM

## 2018-04-20 DIAGNOSIS — E785 Hyperlipidemia, unspecified: Secondary | ICD-10-CM

## 2018-04-20 DIAGNOSIS — I10 Essential (primary) hypertension: Secondary | ICD-10-CM

## 2018-04-20 DIAGNOSIS — Z23 Encounter for immunization: Secondary | ICD-10-CM | POA: Diagnosis not present

## 2018-04-20 DIAGNOSIS — D72829 Elevated white blood cell count, unspecified: Secondary | ICD-10-CM

## 2018-04-20 LAB — BASIC METABOLIC PANEL
BUN: 20 mg/dL (ref 6–23)
CALCIUM: 9.7 mg/dL (ref 8.4–10.5)
CO2: 28 mEq/L (ref 19–32)
CREATININE: 1.34 mg/dL — AB (ref 0.40–1.20)
Chloride: 102 mEq/L (ref 96–112)
GFR: 51.53 mL/min — AB (ref >60.00)
GLUCOSE: 112 mg/dL — AB (ref 70–99)
Potassium: 3.7 mEq/L (ref 3.5–5.1)
SODIUM: 137 meq/L (ref 135–145)

## 2018-04-20 LAB — HEMOGLOBIN A1C: Hgb A1c MFr Bld: 5.5 % (ref 4.6–6.5)

## 2018-04-20 MED ORDER — ROSUVASTATIN CALCIUM 10 MG PO TABS: 10.0000 mg | ORAL_TABLET | Freq: Every day | ORAL | 1 refills | Status: DC

## 2018-04-20 MED ORDER — CRISABOROLE 2 % EX OINT: 1.0000 | TOPICAL_OINTMENT | Freq: Two times a day (BID) | CUTANEOUS | 3 refills | Status: DC

## 2018-04-20 NOTE — Patient Instructions (Signed)

## 2018-04-20 NOTE — Progress Notes (Signed)
Subjective:  Patient ID: Holly Fowler, female    DOB: Apr 25, 1956  Age: 62 y.o. MRN: [id number]  CC: Hypertension; Hyperlipidemia; and Rash   HPI KOLBY MYUNG presents for f/up - She is not taking Livalo because it was too expensive.  She never tried it.  She complains of persistent, scattered rashes on her arms and legs.  The rash is itchy and does not respond very well to triamcinolone.  Outpatient Medications Prior to Visit  Medication Sig Dispense Refill  . chlorthalidone (HYGROTON) 25 MG tablet Take 1 tablet (25 mg total) by mouth daily. 90 tablet 1  . levocetirizine (XYZAL) 5 MG tablet Take 1 tablet (5 mg total) by mouth every evening. 90 tablet 3  . triamcinolone (NASACORT AQ) 55 MCG/ACT AERO nasal inhaler Place 2 sprays into the nose 2 (two) times daily. 3 Inhaler 3  . verapamil (CALAN-SR) 240 MG CR tablet Take 1 tablet (240 mg total) by mouth at bedtime. 90 tablet 1  . Pitavastatin Calcium 2 MG TABS Take 1 tablet (2 mg total) by mouth daily. 90 tablet 1  . triamcinolone cream (KENALOG) 0.5 % APPLY TWICE DAILY TO SKIN AS NEEDED; APPLY SPARINGLY AS THIS MEDICATION CAN BLEACH YOUR SKIN 15 g 3   No facility-administered medications prior to visit.     ROS Review of Systems  Constitutional: Negative.  Negative for chills, diaphoresis, fatigue and fever.  HENT: Negative.   Eyes: Negative for visual disturbance.  Respiratory: Negative for cough, chest tightness and shortness of breath.   Cardiovascular: Negative for chest pain, palpitations and leg swelling.  Gastrointestinal: Negative for abdominal pain, constipation, diarrhea, nausea and vomiting.  Endocrine: Negative.   Genitourinary: Negative.  Negative for difficulty urinating.  Musculoskeletal: Negative.  Negative for arthralgias, back pain and myalgias.  Skin: Positive for rash. Negative for color change.  Neurological: Negative.  Negative for dizziness, weakness, light-headedness and numbness.  Hematological: Negative  for adenopathy. Does not bruise/bleed easily.  Psychiatric/Behavioral: Negative.     Objective:  BP 130/68 (BP Location: Left Arm, Patient Position: Sitting, Cuff Size: Large)   Pulse 73   Temp 98 F (36.7 C) (Oral)   Resp 16   Ht 5\' 3"  (1.6 m)   Wt 212 lb 12 oz (96.5 kg)   SpO2 98%   BMI 37.69 kg/m   BP Readings from Last 3 Encounters:  04/20/18 130/68  10/19/17 130/74  08/13/17 130/90    Wt Readings from Last 3 Encounters:  04/20/18 212 lb 12 oz (96.5 kg)  10/19/17 212 lb 12 oz (96.5 kg)  08/13/17 217 lb (98.4 kg)    Physical Exam  Constitutional: She is oriented to person, place, and time. No distress.  HENT:  Mouth/Throat: Oropharynx is clear and moist. No oropharyngeal exudate.  Eyes: Conjunctivae are normal. No scleral icterus.  Neck: Normal range of motion. Neck supple. No JVD present. No thyromegaly present.  Cardiovascular: Normal rate, regular rhythm and normal heart sounds. Exam reveals no gallop.  No murmur heard. Pulmonary/Chest: Effort normal and breath sounds normal. No respiratory distress. She has no wheezes. She has no rales.  Abdominal: Soft. Bowel sounds are normal. She exhibits no mass. There is no hepatosplenomegaly. There is no tenderness.  Musculoskeletal: Normal range of motion. She exhibits no edema, tenderness or deformity.  Lymphadenopathy:    She has no cervical adenopathy.  Neurological: She is alert and oriented to person, place, and time.  Skin: Skin is warm and dry. Rash noted. Rash is  macular. She is not diaphoretic. No pallor.  She has rare, faint, scattered eczematous patches on her extremities.  Vitals reviewed.   Lab Results  Component Value Date   WBC 14.7 (H) 10/19/2017   HGB 13.3 10/19/2017   HCT 40.8 10/19/2017   PLT 412.0 (H) 10/19/2017   GLUCOSE 112 (H) 04/20/2018   CHOL 206 (H) 10/19/2017   TRIG 124.0 10/19/2017   HDL 43.10 10/19/2017   LDLCALC 138 (H) 10/19/2017   ALT 11 08/13/2017   AST 11 08/13/2017   NA 137  04/20/2018   K 3.7 04/20/2018   CL 102 04/20/2018   CREATININE 1.34 (H) 04/20/2018   BUN 20 04/20/2018   CO2 28 04/20/2018   TSH 1.97 04/14/2017   HGBA1C 5.5 04/20/2018    Dg Hip Unilat With Pelvis 2-3 Views Right  Result Date: 08/13/2017 CLINICAL DATA:  One month of groin pain.  No known injury. EXAM: DG HIP (WITH OR WITHOUT PELVIS) 2-3V RIGHT COMPARISON:  None in PACs FINDINGS: AP and lateral views of the right hip reveal the bones to be subjectively adequately mineralized. There is mild-to-moderate narrowing of the right hip joint. There is some scleroses of the subarticular bone of the roof of the acetabulum. There is no acute or healing fracture. The femoral neck, intertrochanteric, and subtrochanteric regions are normal. The observed portions of the right hemipelvis are normal. IMPRESSION: There is moderate degenerative change of the right hip. There is no acute bony abnormality. Electronically Signed   By: David  Martinique M.D.   On: 08/13/2017 15:10    Assessment & Plan:   Valia was seen today for hypertension, hyperlipidemia and rash.  Diagnoses and all orders for this visit:  Need for influenza vaccination -     Flu Vaccine QUAD 36+ mos IM  Kidney disease, chronic, stage III (GFR 30-59 ml/min) ([place])- Her renal function is stable.  She will avoid nephrotoxic agents.  Will continue to maintain good blood pressure control. -     Basic metabolic panel; Future  Essential hypertension- Her blood pressure is adequately well controlled.  Electrolytes are normal, renal function is stable. -     Basic metabolic panel; Future  Eczema, unspecified type- She has not responded to triamcinolone so we will try Eucrisa for this. -     Crisaborole (EUCRISA) 2 % OINT; Apply 1 Act topically 2 (two) times daily.  Hyperlipidemia with target LDL less than 100- She has an elevated ASCVD risk score so I have asked her to take a statin for CV risk reduction. -     rosuvastatin (CRESTOR) 10 MG  tablet; Take 1 tablet (10 mg total) by mouth daily.  Hyperglycemia- Improvement noted. -     Hemoglobin A1c; Future -     Basic metabolic panel; Future   I have discontinued Tyler Pita. Bergman's triamcinolone cream and Pitavastatin Calcium. I am also having her start on Crisaborole and rosuvastatin. Additionally, I am having her maintain her levocetirizine, triamcinolone, verapamil, and chlorthalidone.  Meds ordered this encounter  Medications  . Crisaborole (EUCRISA) 2 % OINT    Sig: Apply 1 Act topically 2 (two) times daily.    Dispense:  100 g    Refill:  3  . rosuvastatin (CRESTOR) 10 MG tablet    Sig: Take 1 tablet (10 mg total) by mouth daily.    Dispense:  90 tablet    Refill:  1     Follow-up: Return in about 6 months (around 10/21/2018).  [name],  MD

## 2018-04-22 NOTE — Assessment & Plan Note (Signed)
Her white cell count and platelet count remain mildly elevated. She is not anemic and her other cell lines are normal. This is consistent with tobacco abuse.

## 2018-04-24 ENCOUNTER — Other Ambulatory Visit: Payer: Self-pay | Admitting: Internal Medicine

## 2018-04-24 DIAGNOSIS — I10 Essential (primary) hypertension: Secondary | ICD-10-CM

## 2018-04-27 ENCOUNTER — Other Ambulatory Visit: Payer: Self-pay | Admitting: Internal Medicine

## 2018-04-27 DIAGNOSIS — E785 Hyperlipidemia, unspecified: Secondary | ICD-10-CM

## 2018-04-27 MED ORDER — ATORVASTATIN CALCIUM 40 MG PO TABS: 40.0000 mg | ORAL_TABLET | Freq: Every day | ORAL | 1 refills | Status: DC

## 2018-05-07 ENCOUNTER — Telehealth: Payer: Self-pay

## 2018-05-07 NOTE — Telephone Encounter (Signed)
Pt informed

## 2018-05-07 NOTE — Telephone Encounter (Signed)
Key: [id number]   PA was approved until 05/05/2019. Can you inform patient of same?

## 2018-07-13 ENCOUNTER — Other Ambulatory Visit: Payer: Self-pay | Admitting: Internal Medicine

## 2018-07-13 DIAGNOSIS — L309 Dermatitis, unspecified: Secondary | ICD-10-CM

## 2018-07-13 MED ORDER — TRIAMCINOLONE ACETONIDE 0.5 % EX OINT: 1.0000 "application " | TOPICAL_OINTMENT | Freq: Two times a day (BID) | CUTANEOUS | 2 refills | Status: DC

## 2018-10-21 ENCOUNTER — Ambulatory Visit (INDEPENDENT_AMBULATORY_CARE_PROVIDER_SITE_OTHER)
Admission: RE | Admit: 2018-10-21 | Discharge: 2018-10-21 | Disposition: A | Discharge status: Home / Self Care | Payer: BC Managed Care – PPO | Source: Ambulatory Visit | Attending: Internal Medicine | Admitting: Internal Medicine

## 2018-10-21 ENCOUNTER — Encounter: Payer: Self-pay | Admitting: Internal Medicine

## 2018-10-21 ENCOUNTER — Other Ambulatory Visit (INDEPENDENT_AMBULATORY_CARE_PROVIDER_SITE_OTHER): Payer: BC Managed Care – PPO

## 2018-10-21 ENCOUNTER — Ambulatory Visit: Payer: BC Managed Care – PPO | Admitting: Internal Medicine

## 2018-10-21 VITALS — BP 132/78 | HR 84 | Temp 97.8°F | Resp 16 | Ht 63.0 in | Wt 214.2 lb

## 2018-10-21 DIAGNOSIS — G8929 Other chronic pain: Secondary | ICD-10-CM

## 2018-10-21 DIAGNOSIS — N183 Chronic kidney disease, stage 3 unspecified: Secondary | ICD-10-CM

## 2018-10-21 DIAGNOSIS — E785 Hyperlipidemia, unspecified: Secondary | ICD-10-CM | POA: Diagnosis not present

## 2018-10-21 DIAGNOSIS — M25551 Pain in right hip: Secondary | ICD-10-CM

## 2018-10-21 DIAGNOSIS — D72829 Elevated white blood cell count, unspecified: Secondary | ICD-10-CM | POA: Diagnosis not present

## 2018-10-21 DIAGNOSIS — Z0000 Encounter for general adult medical examination without abnormal findings: Secondary | ICD-10-CM

## 2018-10-21 DIAGNOSIS — I10 Essential (primary) hypertension: Secondary | ICD-10-CM

## 2018-10-21 DIAGNOSIS — R739 Hyperglycemia, unspecified: Secondary | ICD-10-CM

## 2018-10-21 DIAGNOSIS — M25552 Pain in left hip: Secondary | ICD-10-CM | POA: Diagnosis not present

## 2018-10-21 DIAGNOSIS — D473 Essential (hemorrhagic) thrombocythemia: Secondary | ICD-10-CM | POA: Diagnosis not present

## 2018-10-21 DIAGNOSIS — D75839 Thrombocytosis, unspecified: Secondary | ICD-10-CM

## 2018-10-21 DIAGNOSIS — M160 Bilateral primary osteoarthritis of hip: Secondary | ICD-10-CM

## 2018-10-21 DIAGNOSIS — Z1231 Encounter for screening mammogram for malignant neoplasm of breast: Secondary | ICD-10-CM

## 2018-10-21 LAB — TSH: TSH: 2.27 u[IU]/mL (ref 0.35–4.50)

## 2018-10-21 LAB — BASIC METABOLIC PANEL
BUN: 21 mg/dL (ref 6–23)
CHLORIDE: 103 meq/L (ref 96–112)
CO2: 29 mEq/L (ref 19–32)
Calcium: 9.7 mg/dL (ref 8.4–10.5)
Creatinine, Ser: 1.33 mg/dL — ABNORMAL HIGH (ref 0.40–1.20)
GFR: 48.83 mL/min — ABNORMAL LOW (ref >60.00)
Glucose, Bld: 104 mg/dL — ABNORMAL HIGH (ref 70–99)
POTASSIUM: 4.3 meq/L (ref 3.5–5.1)
Sodium: 139 mEq/L (ref 135–145)

## 2018-10-21 LAB — URINALYSIS, ROUTINE W REFLEX MICROSCOPIC
Bilirubin Urine: NEGATIVE
Hgb urine dipstick: NEGATIVE
Ketones, ur: NEGATIVE
Leukocytes,Ua: NEGATIVE
Nitrite: NEGATIVE
Specific Gravity, Urine: 1.02 (ref 1.000–1.030)
Total Protein, Urine: NEGATIVE
Urine Glucose: NEGATIVE
Urobilinogen, UA: 1 (ref 0.0–1.0)
pH: 7 (ref 5.0–8.0)

## 2018-10-21 LAB — CBC WITH DIFFERENTIAL/PLATELET
BASOS PCT: 0.2 % (ref 0.0–3.0)
Basophils Absolute: 0 10*3/uL (ref 0.0–0.1)
Eosinophils Absolute: 0.1 10*3/uL (ref 0.0–0.7)
Eosinophils Relative: 0.6 % (ref 0.0–5.0)
HCT: 38.5 % (ref 36.0–46.0)
Hemoglobin: 12.3 g/dL (ref 12.0–15.0)
LYMPHS PCT: 20.6 % (ref 12.0–46.0)
Lymphs Abs: 2.8 10*3/uL (ref 0.7–4.0)
MCHC: 31.9 g/dL (ref 30.0–36.0)
MCV: 82.1 fl (ref 78.0–100.0)
Monocytes Absolute: 1.1 10*3/uL — ABNORMAL HIGH (ref 0.1–1.0)
Monocytes Relative: 8 % (ref 3.0–12.0)
Neutro Abs: 9.5 10*3/uL — ABNORMAL HIGH (ref 1.4–7.7)
Neutrophils Relative %: 70.6 % (ref 43.0–77.0)
Platelets: 431 10*3/uL — ABNORMAL HIGH (ref 150.0–400.0)
RBC: 4.68 Mil/uL (ref 3.87–5.11)
RDW: 13.6 % (ref 11.5–15.5)
WBC: 13.4 10*3/uL — ABNORMAL HIGH (ref 4.0–10.5)

## 2018-10-21 LAB — IBC PANEL
IRON: 93 ug/dL (ref 42–145)
Saturation Ratios: 22 % (ref 20.0–50.0)
Transferrin: 302 mg/dL (ref 212.0–360.0)

## 2018-10-21 LAB — LIPID PANEL
Cholesterol: 117 mg/dL (ref 0–200)
HDL: 40 mg/dL (ref >39.00)
LDL Cholesterol: 61 mg/dL (ref 0–99)
NonHDL: 76.5
Total CHOL/HDL Ratio: 3
Triglycerides: 78 mg/dL (ref 0.0–149.0)
VLDL: 15.6 mg/dL (ref 0.0–40.0)

## 2018-10-21 LAB — FOLATE: Folate: 10.1 ng/mL (ref >5.9)

## 2018-10-21 LAB — FERRITIN: Ferritin: 146.9 ng/mL (ref 10.0–291.0)

## 2018-10-21 LAB — VITAMIN B12: Vitamin B-12: 306 pg/mL (ref 211–911)

## 2018-10-21 LAB — HEMOGLOBIN A1C: Hgb A1c MFr Bld: 5.5 % (ref 4.6–6.5)

## 2018-10-21 MED ORDER — ATORVASTATIN CALCIUM 40 MG PO TABS: 40.0000 mg | ORAL_TABLET | Freq: Every day | ORAL | 1 refills | Status: DC

## 2018-10-21 MED ORDER — VERAPAMIL HCL ER 240 MG PO TBCR: 240.0000 mg | EXTENDED_RELEASE_TABLET | Freq: Every day | ORAL | 1 refills | Status: DC

## 2018-10-21 MED ORDER — CHLORTHALIDONE 25 MG PO TABS: 25.0000 mg | ORAL_TABLET | Freq: Every day | ORAL | 1 refills | Status: DC

## 2018-10-21 NOTE — Progress Notes (Signed)
Subjective:  Patient ID: Holly Fowler, female    DOB: 27-Sep-1955  Age: 63 y.o. MRN: [id number]  CC: Hypertension; Hyperlipidemia; and Annual Exam   HPI [name] presents for a CPX.  She complains of chronic, worsening bilateral hip pain.  She also complains of pain in both knees.  She gets symptom relief with Tylenol.  She tells me her blood pressure has been well controlled.  She denies any recent episodes of CP, DOE, palpitations, edema, or fatigue.  Outpatient Medications Prior to Visit  Medication Sig Dispense Refill  . levocetirizine (XYZAL) 5 MG tablet Take 1 tablet (5 mg total) by mouth every evening. 90 tablet 3  . triamcinolone (NASACORT AQ) 55 MCG/ACT AERO nasal inhaler Place 2 sprays into the nose 2 (two) times daily. 3 Inhaler 3  . triamcinolone ointment (KENALOG) 0.5 % Apply 1 application topically 2 (two) times daily. 60 g 2  . atorvastatin (LIPITOR) 40 MG tablet Take 1 tablet (40 mg total) by mouth daily. 90 tablet 1  . chlorthalidone (HYGROTON) 25 MG tablet TAKE 1 TABLET BY MOUTH ONCE DAILY 90 tablet 1  . verapamil (CALAN-SR) 240 MG CR tablet TAKE 1 TABLET BY MOUTH ONCE DAILY AT BEDTIME 90 tablet 1   No facility-administered medications prior to visit.     ROS Review of Systems  Constitutional: Negative.  Negative for diaphoresis and fatigue.  HENT: Negative.   Eyes: Negative.  Negative for visual disturbance.  Respiratory: Negative for cough, chest tightness, shortness of breath and wheezing.   Cardiovascular: Negative for palpitations and leg swelling.  Gastrointestinal: Negative for abdominal pain, constipation, diarrhea and nausea.  Endocrine: Negative.   Genitourinary: Negative.  Negative for difficulty urinating and dysuria.  Musculoskeletal: Positive for arthralgias. Negative for back pain, myalgias and neck pain.  Skin: Negative.  Negative for color change, pallor and rash.  Neurological: Negative.  Negative for dizziness, weakness,  light-headedness and headaches.  Hematological: Negative for adenopathy. Does not bruise/bleed easily.  Psychiatric/Behavioral: Negative.     Objective:  BP 132/78 (BP Location: Left Arm, Patient Position: Sitting, Cuff Size: Normal)   Pulse 84   Temp 97.8 F (36.6 C) (Oral)   Resp 16   Ht 5\' 3"  (1.6 m)   Wt 214 lb 4 oz (97.2 kg)   SpO2 98%   BMI 37.95 kg/m   BP Readings from Last 3 Encounters:  10/21/18 132/78  04/20/18 130/68  10/19/17 130/74    Wt Readings from Last 3 Encounters:  10/21/18 214 lb 4 oz (97.2 kg)  04/20/18 212 lb 12 oz (96.5 kg)  10/19/17 212 lb 12 oz (96.5 kg)    Physical Exam Vitals signs reviewed.  Constitutional:      Appearance: She is obese. She is not ill-appearing or diaphoretic.  HENT:     Nose: Nose normal.     Mouth/Throat:     Mouth: Mucous membranes are moist.     Pharynx: Oropharynx is clear.  Eyes:     Conjunctiva/sclera: Conjunctivae normal.  Neck:     Musculoskeletal: Normal range of motion and neck supple.  Cardiovascular:     Rate and Rhythm: Normal rate and regular rhythm.     Heart sounds: No murmur.  Pulmonary:     Effort: Pulmonary effort is normal.     Breath sounds: Normal breath sounds. No wheezing or rales.  Abdominal:     General: Abdomen is flat. Bowel sounds are normal.     Palpations: There is no  mass.     Tenderness: There is no abdominal tenderness.  Musculoskeletal: Normal range of motion.        General: No swelling or tenderness.     Right hip: Normal.     Left hip: Normal.     Left lower leg: No edema.  Skin:    General: Skin is warm and dry.  Neurological:     General: No focal deficit present.     Mental Status: She is oriented to person, place, and time. Mental status is at baseline.  Psychiatric:        Mood and Affect: Mood normal.        Behavior: Behavior normal.        Thought Content: Thought content normal.        Judgment: Judgment normal.     Lab Results  Component Value Date    WBC 13.4 (H) 10/21/2018   HGB 12.3 10/21/2018   HCT 38.5 10/21/2018   PLT 431.0 (H) 10/21/2018   GLUCOSE 104 (H) 10/21/2018   CHOL 117 10/21/2018   TRIG 78.0 10/21/2018   HDL 40.00 10/21/2018   LDLCALC 61 10/21/2018   ALT 11 08/13/2017   AST 11 08/13/2017   NA 139 10/21/2018   K 4.3 10/21/2018   CL 103 10/21/2018   CREATININE 1.33 (H) 10/21/2018   BUN 21 10/21/2018   CO2 29 10/21/2018   TSH 2.27 10/21/2018   HGBA1C 5.5 10/21/2018    Dg Hip Unilat With Pelvis 2-3 Views Right  Result Date: 08/13/2017 CLINICAL DATA:  One month of groin pain.  No known injury. EXAM: DG HIP (WITH OR WITHOUT PELVIS) 2-3V RIGHT COMPARISON:  None in PACs FINDINGS: AP and lateral views of the right hip reveal the bones to be subjectively adequately mineralized. There is mild-to-moderate narrowing of the right hip joint. There is some scleroses of the subarticular bone of the roof of the acetabulum. There is no acute or healing fracture. The femoral neck, intertrochanteric, and subtrochanteric regions are normal. The observed portions of the right hemipelvis are normal. IMPRESSION: There is moderate degenerative change of the right hip. There is no acute bony abnormality. Electronically Signed   By: David  Martinique M.D.   On: 08/13/2017 15:10   Dg Hips Bilat With Pelvis Min 5 Views  Result Date: 10/21/2018 CLINICAL DATA:  Bilateral hip pain with stiffness EXAM: DG HIP (WITH OR WITHOUT PELVIS) 5+V BILAT COMPARISON:  None. FINDINGS: Moderate to advanced degenerative change in the right hip joint with joint space narrowing and spurring. Negative for fracture or AVN Mild to moderate degenerative change left hip with mild joint space narrowing. Subchondral cyst in the acetabular roof. No fracture or AVN. IMPRESSION: Osteoarthritis of both hips, right greater than left. No acute abnormality. Electronically Signed   By: Franchot Gallo M.D.   On: 10/21/2018 14:45     Assessment & Plan:   Dmya was seen today for  hypertension, hyperlipidemia and annual exam.  Diagnoses and all orders for this visit:  Essential hypertension- Her blood pressure is adequately well controlled.  Electrolytes are normal. -     Basic metabolic panel; Future -     chlorthalidone (HYGROTON) 25 MG tablet; Take 1 tablet (25 mg total) by mouth daily. -     verapamil (CALAN-SR) 240 MG CR tablet; Take 1 tablet (240 mg total) by mouth at bedtime.  Kidney disease, chronic, stage III (GFR 30-59 ml/min) ([place])- Her renal function is stable.  I have asked her  to avoid nephrotoxic agents.  Will continue to maintain blood pressure goal of less than 130/80. -     Basic metabolic panel; Future -     Urinalysis, Routine w reflex microscopic; Future  Hyperglycemia- Her A1c is normal. -     Hemoglobin A1c; Future  Thrombocytosis ([place])- She has a mild, stable, chronic increase in her white blood cell count and platelet counts.  There is no history of coagulopathy.  Screening for vitamin deficiencies is negative.  Her other cell lines is normal.  This is likely caused by her tobacco abuse.  I have asked her to quit smoking. -     CBC with Differential/Platelet; Future -     IBC panel; Future -     Vitamin B12; Future -     Folate; Future -     Ferritin; Future  Hyperlipidemia with target LDL less than 100- She has achieved her LDL goal and is doing well on the statin. -     Lipid panel; Future -     TSH; Future -     atorvastatin (LIPITOR) 40 MG tablet; Take 1 tablet (40 mg total) by mouth daily.  Leukocytosis, unspecified type-see above. -     CBC with Differential/Platelet; Future  Chronic hip pain, bilateral -     DG HIPS BILAT WITH PELVIS MIN 5 VIEWS; Future  Visit for screening mammogram -     MM DIGITAL SCREENING BILATERAL; Future  Primary osteoarthritis of both hips -     Ambulatory referral to Orthopedic Surgery   I have changed Lorice D. Wittmann's chlorthalidone and verapamil. I am also having her maintain her  levocetirizine, triamcinolone, triamcinolone ointment, and atorvastatin.  Meds ordered this encounter  Medications  . atorvastatin (LIPITOR) 40 MG tablet    Sig: Take 1 tablet (40 mg total) by mouth daily.    Dispense:  90 tablet    Refill:  1  . chlorthalidone (HYGROTON) 25 MG tablet    Sig: Take 1 tablet (25 mg total) by mouth daily.    Dispense:  90 tablet    Refill:  1  . verapamil (CALAN-SR) 240 MG CR tablet    Sig: Take 1 tablet (240 mg total) by mouth at bedtime.    Dispense:  90 tablet    Refill:  1     Follow-up: Return in about 6 months (around 04/21/2019).  [name], MD

## 2018-10-21 NOTE — Patient Instructions (Signed)

## 2018-10-24 NOTE — Assessment & Plan Note (Signed)
Exam completed Labs reviewed vaccines reviewed Pap and colon cancer screening are up-to-date She has been referred for screening mammogram Patient education material was given.

## 2018-11-09 ENCOUNTER — Encounter: Payer: Self-pay | Admitting: Internal Medicine

## 2018-11-09 ENCOUNTER — Other Ambulatory Visit: Payer: Self-pay

## 2018-11-09 ENCOUNTER — Other Ambulatory Visit: Payer: BC Managed Care – PPO

## 2018-11-09 ENCOUNTER — Ambulatory Visit: Payer: BC Managed Care – PPO | Admitting: Internal Medicine

## 2018-11-09 VITALS — BP 130/70 | HR 85 | Temp 98.0°F | Resp 16 | Wt 214.0 lb

## 2018-11-09 DIAGNOSIS — L720 Epidermal cyst: Secondary | ICD-10-CM | POA: Diagnosis not present

## 2018-11-09 DIAGNOSIS — L989 Disorder of the skin and subcutaneous tissue, unspecified: Secondary | ICD-10-CM

## 2018-11-09 NOTE — Patient Instructions (Signed)
Incision and Drainage    Incision and drainage is a surgical procedure to open and drain a fluid-filled sac. The sac may be filled with pus, mucus, or blood. Examples of fluid-filled sacs that may need surgical drainage include cysts, skin infections (abscesses), and red lumps that develop from a ruptured cyst or a small abscess (boils).  You may need this procedure if the affected area is large, painful, infected, or not healing well.  Tell a health care provider about:   Any allergies you have.   All medicines you are taking, including vitamins, herbs, eye drops, creams, and over-the-counter medicines.   Any problems you or family members have had with anesthetic medicines.   Any blood disorders you have.   Any surgeries you have had.   Any medical conditions you have.   Whether you are pregnant or may be pregnant.  What are the risks?  Generally, this is a safe procedure. However, problems may occur, including:   Infection.   Bleeding.   Allergic reactions to medicines.   Scarring.  What happens before the procedure?   You may need an ultrasound or other imaging tests to see how large or deep the fluid-filled sac is.   You may have blood tests to check for infection.   You may get a tetanus shot.   You may be given antibiotic medicine to help prevent infection.   Follow instructions from your health care provider about eating or drinking restrictions.   Ask your health care provider about:  ? Changing or stopping your regular medicines. This is especially important if you are taking diabetes medicines or blood thinners.  ? Taking medicines such as aspirin and ibuprofen. These medicines can thin your blood. Do not take these medicines before your procedure if your health care provider instructs you not to.   Plan to have someone take you home after the procedure.   If you will be going home right after the procedure, plan to have someone stay with you for 24 hours.  What happens during the  procedure?   To reduce your risk of infection:  ? Your health care team will wash or sanitize their hands.  ? Your skin will be washed with soap.   You will be given one or more of the following:  ? A medicine to help you relax (sedative).  ? A medicine to numb the area (local anesthetic).  ? A medicine to make you fall asleep (general anesthetic).   An incision will be made in the top of the fluid-filled sac.   The contents of the sac may be squeezed out, or a syringe or tube (catheter)may be used to empty the sac.   The catheter may be left in place for several weeks to drain any fluid. Or, your health care provider may stitch open the edges of the incision to make a long-term opening for drainage (marsupialization).   The inside of the sac may be washed out (irrigated) with a sterile solution and packed with gauze before it is covered with a bandage (dressing).  The procedure may vary among health care providers and hospitals.  What happens after the procedure?   Your blood pressure, heart rate, breathing rate, and blood oxygen level will be monitored often until the medicines you were given have worn off.   Do not drive for 24 hours if you received a sedative.  This information is not intended to replace advice given to you by your health care   provider. Make sure you discuss any questions you have with your health care provider.  Document Released: 02/04/2001 Document Revised: 01/17/2016 Document Reviewed: 06/01/2015  [employer]vier Interactive Patient Education  2019 [employer]

## 2018-11-10 ENCOUNTER — Encounter: Payer: Self-pay | Admitting: Internal Medicine

## 2018-11-10 ENCOUNTER — Other Ambulatory Visit (HOSPITAL_COMMUNITY)
Admission: RE | Admit: 2018-11-10 | Discharge: 2018-11-10 | Disposition: A | Discharge status: Home / Self Care | Payer: BC Managed Care – PPO | Source: Ambulatory Visit | Attending: Internal Medicine | Admitting: Internal Medicine

## 2018-11-10 DIAGNOSIS — L989 Disorder of the skin and subcutaneous tissue, unspecified: Secondary | ICD-10-CM | POA: Insufficient documentation

## 2018-11-10 DIAGNOSIS — L720 Epidermal cyst: Secondary | ICD-10-CM | POA: Insufficient documentation

## 2018-11-10 NOTE — Progress Notes (Signed)
Subjective:  Patient ID: Holly Fowler, female    DOB: [birth year]  Age: 63 y.o. MRN: [id number]  CC: Follow-up   HPI ARLO BUFFONE presents for concerns about an asymptomatic cystic lesion on her left frontal scalp for years.  She feels like it is getting bigger and wants to have it removed.  Outpatient Medications Prior to Visit  Medication Sig Dispense Refill  . atorvastatin (LIPITOR) 40 MG tablet Take 1 tablet (40 mg total) by mouth daily. 90 tablet 1  . chlorthalidone (HYGROTON) 25 MG tablet Take 1 tablet (25 mg total) by mouth daily. 90 tablet 1  . levocetirizine (XYZAL) 5 MG tablet Take 1 tablet (5 mg total) by mouth every evening. 90 tablet 3  . triamcinolone (NASACORT AQ) 55 MCG/ACT AERO nasal inhaler Place 2 sprays into the nose 2 (two) times daily. 3 Inhaler 3  . triamcinolone ointment (KENALOG) 0.5 % Apply 1 application topically 2 (two) times daily. 60 g 2  . verapamil (CALAN-SR) 240 MG CR tablet Take 1 tablet (240 mg total) by mouth at bedtime. 90 tablet 1   No facility-administered medications prior to visit.     ROS Review of Systems  All other systems reviewed and are negative.   Objective:  BP 130/70 (BP Location: Left Arm, Patient Position: Sitting, Cuff Size: Large)   Pulse 85   Temp 98 F (36.7 C) (Oral)   Resp 16   Wt 214 lb (97.1 kg)   SpO2 97%   BMI 37.91 kg/m   BP Readings from Last 3 Encounters:  11/09/18 130/70  10/21/18 132/78  04/20/18 130/68    Wt Readings from Last 3 Encounters:  11/09/18 214 lb (97.1 kg)  10/21/18 214 lb 4 oz (97.2 kg)  04/20/18 212 lb 12 oz (96.5 kg)    Physical Exam HENT:     Head:   Skin:    Comments: The area was cleaned with Betadine and then prepped and draped in sterile fashion.  Local anesthesia was obtained with instillation of 1% lidocaine with epi using 2 cc.  Next, a 4 mm punch incision was made and the contents of an epidermal inclusion cyst was removed and sent for pathology.  The cavity was packed  with iodoform.  She tolerated the procedure well.  Hemostasis was maintained.  A dressing was applied.     Lab Results  Component Value Date   WBC 13.4 (H) 10/21/2018   HGB 12.3 10/21/2018   HCT 38.5 10/21/2018   PLT 431.0 (H) 10/21/2018   GLUCOSE 104 (H) 10/21/2018   CHOL 117 10/21/2018   TRIG 78.0 10/21/2018   HDL 40.00 10/21/2018   LDLCALC 61 10/21/2018   ALT 11 08/13/2017   AST 11 08/13/2017   NA 139 10/21/2018   K 4.3 10/21/2018   CL 103 10/21/2018   CREATININE 1.33 (H) 10/21/2018   BUN 21 10/21/2018   CO2 29 10/21/2018   TSH 2.27 10/21/2018   HGBA1C 5.5 10/21/2018    Dg Hips Bilat With Pelvis Min 5 Views  Result Date: 10/21/2018 CLINICAL DATA:  Bilateral hip pain with stiffness EXAM: DG HIP (WITH OR WITHOUT PELVIS) 5+V BILAT COMPARISON:  None. FINDINGS: Moderate to advanced degenerative change in the right hip joint with joint space narrowing and spurring. Negative for fracture or AVN Mild to moderate degenerative change left hip with mild joint space narrowing. Subchondral cyst in the acetabular roof. No fracture or AVN. IMPRESSION: Osteoarthritis of both hips, right greater than left. No  acute abnormality. Electronically Signed   By: Franchot Gallo M.D.   On: 10/21/2018 14:45    Assessment & Plan:   Shelie was seen today for follow-up.  Diagnoses and all orders for this visit:  Skin lesion- The specimen has been sent to Derm pathology to be certain that there is not a malignant process. -     Dermatology pathology; Future  EIC (epidermal inclusion cyst)- The cyst has been adequately removed.  Wound care was discussed.  She will return in 2 days to have the packing removed and for a wound check.   I am having Lamari D. Kalbfleisch maintain her levocetirizine, triamcinolone, triamcinolone ointment, atorvastatin, chlorthalidone, and verapamil.  No orders of the defined types were placed in this encounter.    Follow-up: Return in about 2 days (around 11/11/2018).   [name], MD

## 2018-11-11 ENCOUNTER — Ambulatory Visit: Payer: BC Managed Care – PPO | Admitting: Internal Medicine

## 2018-11-11 ENCOUNTER — Other Ambulatory Visit: Payer: Self-pay

## 2018-11-11 ENCOUNTER — Encounter: Payer: Self-pay | Admitting: Internal Medicine

## 2018-11-11 VITALS — BP 130/76 | HR 92 | Temp 98.5°F | Resp 16 | Ht 63.0 in | Wt 214.1 lb

## 2018-11-11 DIAGNOSIS — Z4889 Encounter for other specified surgical aftercare: Secondary | ICD-10-CM | POA: Diagnosis not present

## 2018-11-11 NOTE — Progress Notes (Signed)
Subjective:  Patient ID: Holly Fowler, female    DOB: 17-Mar-1956  Age: 63 y.o. MRN: [id number]  CC: Wound Check   HPI Holly Fowler presents for a wound check - She is 2 days status post removal of a cyst on her left forehead.  She tells me the area has healed nicely with no pain, redness, swelling, or drainage.  The specimen was sent to the lab but there is no pathology report back yet.  Outpatient Medications Prior to Visit  Medication Sig Dispense Refill  . atorvastatin (LIPITOR) 40 MG tablet Take 1 tablet (40 mg total) by mouth daily. 90 tablet 1  . chlorthalidone (HYGROTON) 25 MG tablet Take 1 tablet (25 mg total) by mouth daily. 90 tablet 1  . levocetirizine (XYZAL) 5 MG tablet Take 1 tablet (5 mg total) by mouth every evening. 90 tablet 3  . triamcinolone (NASACORT AQ) 55 MCG/ACT AERO nasal inhaler Place 2 sprays into the nose 2 (two) times daily. 3 Inhaler 3  . triamcinolone ointment (KENALOG) 0.5 % Apply 1 application topically 2 (two) times daily. 60 g 2  . verapamil (CALAN-SR) 240 MG CR tablet Take 1 tablet (240 mg total) by mouth at bedtime. 90 tablet 1   No facility-administered medications prior to visit.     ROS Review of Systems  Constitutional: Negative.     Objective:  BP 130/76 (BP Location: Left Arm, Patient Position: Sitting, Cuff Size: Large)   Pulse 92   Temp 98.5 F (36.9 C) (Oral)   Resp 16   Ht 5\' 3"  (1.6 m)   Wt 214 lb 1.3 oz (97.1 kg)   SpO2 96%   BMI 37.92 kg/m   BP Readings from Last 3 Encounters:  11/11/18 130/76  11/09/18 130/70  10/21/18 132/78    Wt Readings from Last 3 Encounters:  11/11/18 214 lb 1.3 oz (97.1 kg)  11/09/18 214 lb (97.1 kg)  10/21/18 214 lb 4 oz (97.2 kg)    Physical Exam HENT:     Head:      Lab Results  Component Value Date   WBC 13.4 (H) 10/21/2018   HGB 12.3 10/21/2018   HCT 38.5 10/21/2018   PLT 431.0 (H) 10/21/2018   GLUCOSE 104 (H) 10/21/2018   CHOL 117 10/21/2018   TRIG 78.0 10/21/2018   HDL 40.00 10/21/2018   LDLCALC 61 10/21/2018   ALT 11 08/13/2017   AST 11 08/13/2017   NA 139 10/21/2018   K 4.3 10/21/2018   CL 103 10/21/2018   CREATININE 1.33 (H) 10/21/2018   BUN 21 10/21/2018   CO2 29 10/21/2018   TSH 2.27 10/21/2018   HGBA1C 5.5 10/21/2018    Dg Hips Bilat With Pelvis Min 5 Views  Result Date: 10/21/2018 CLINICAL DATA:  Bilateral hip pain with stiffness EXAM: DG HIP (WITH OR WITHOUT PELVIS) 5+V BILAT COMPARISON:  None. FINDINGS: Moderate to advanced degenerative change in the right hip joint with joint space narrowing and spurring. Negative for fracture or AVN Mild to moderate degenerative change left hip with mild joint space narrowing. Subchondral cyst in the acetabular roof. No fracture or AVN. IMPRESSION: Osteoarthritis of both hips, right greater than left. No acute abnormality. Electronically Signed   By: Franchot Gallo M.D.   On: 10/21/2018 14:45    Assessment & Plan:   [name] was seen today for wound check.  Diagnoses and all orders for this visit:  Encounter for post surgical wound check- The site is healing nicely  with no evidence of complications or infection.  Triple antibiotic and a Band-Aid were applied.  She was educated regarding wound care. She will let me know if she develops any new symptoms.   I am having [name] maintain her levocetirizine, triamcinolone, triamcinolone ointment, atorvastatin, chlorthalidone, and verapamil.  No orders of the defined types were placed in this encounter.    Follow-up: Return if symptoms worsen or fail to improve.  [name], MD

## 2018-11-11 NOTE — Patient Instructions (Signed)
Wound Care, Adult  Taking care of your wound properly can help to prevent pain, infection, and scarring. It can also help your wound to heal more quickly.  How to care for your wound  Wound care          Follow instructions from your health care provider about how to take care of your wound. Make sure you:  ? Wash your hands with soap and water before you change the bandage (dressing). If soap and water are not available, use hand sanitizer.  ? Change your dressing as told by your health care provider.  ? Leave stitches (sutures), skin glue, or adhesive strips in place. These skin closures may need to stay in place for 2 weeks or longer. If adhesive strip edges start to loosen and curl up, you may trim the loose edges. Do not remove adhesive strips completely unless your health care provider tells you to do that.   Check your wound area every day for signs of infection. Check for:  ? Redness, swelling, or pain.  ? Fluid or blood.  ? Warmth.  ? Pus or a bad smell.   Ask your health care provider if you should clean the wound with mild soap and water. Doing this may include:  ? Using a clean towel to pat the wound dry after cleaning it. Do not rub or scrub the wound.  ? Applying a cream or ointment. Do this only as told by your health care provider.  ? Covering the incision with a clean dressing.   Ask your health care provider when you can leave the wound uncovered.   Keep the dressing dry until your health care provider says it can be removed. Do not take baths, swim, use a hot tub, or do anything that would put the wound underwater until your health care provider approves. Ask your health care provider if you can take showers. You may only be allowed to take sponge baths.  Medicines     If you were prescribed an antibiotic medicine, cream, or ointment, take or use the antibiotic as told by your health care provider. Do not stop taking or using the antibiotic even if your condition improves.   Take  over-the-counter and prescription medicines only as told by your health care provider. If you were prescribed pain medicine, take it 30 or more minutes before you do any wound care or as told by your health care provider.  General instructions   Return to your normal activities as told by your health care provider. Ask your health care provider what activities are safe.   Do not scratch or pick at the wound.   Do not use any products that contain nicotine or tobacco, such as cigarettes and e-cigarettes. These may delay wound healing. If you need help quitting, ask your health care provider.   Keep all follow-up visits as told by your health care provider. This is important.   Eat a diet that includes protein, vitamin A, vitamin C, and other nutrient-rich foods to help the wound heal.  ? Foods rich in protein include meat, dairy, beans, nuts, and other sources.  ? Foods rich in vitamin A include carrots and dark green, leafy vegetables.  ? Foods rich in vitamin C include citrus, tomatoes, and other fruits and vegetables.  ? Nutrient-rich foods have protein, carbohydrates, fat, vitamins, or minerals. Eat a variety of healthy foods including vegetables, fruits, and whole grains.  Contact a health care provider if:     You received a tetanus shot and you have swelling, severe pain, redness, or bleeding at the injection site.   Your pain is not controlled with medicine.   You have redness, swelling, or pain around the wound.   You have fluid or blood coming from the wound.   Your wound feels warm to the touch.   You have pus or a bad smell coming from the wound.   You have a fever or chills.   You are nauseous or you vomit.   You are dizzy.  Get help right away if:   You have a red streak going away from your wound.   The edges of the wound open up and separate.   Your wound is bleeding, and the bleeding does not stop with gentle pressure.   You have a rash.   You faint.   You have trouble  breathing.  Summary   Always wash your hands with soap and water before changing your bandage (dressing).   To help with healing, eat foods that are rich in protein, vitamin A, vitamin C, and other nutrients.   Check your wound every day for signs of infection. Contact your health care provider if you suspect that your wound is infected.  This information is not intended to replace advice given to you by your health care provider. Make sure you discuss any questions you have with your health care provider.  Document Released: 05/20/2008 Document Revised: 09/22/2017 Document Reviewed: 02/26/2016  Elsevier Interactive Patient Education  2019 [employer]

## 2018-11-12 ENCOUNTER — Encounter: Payer: Self-pay | Admitting: Internal Medicine

## 2018-11-12 DIAGNOSIS — Z4889 Encounter for other specified surgical aftercare: Secondary | ICD-10-CM | POA: Insufficient documentation

## 2019-02-21 ENCOUNTER — Other Ambulatory Visit: Payer: Self-pay | Admitting: Internal Medicine

## 2019-02-21 ENCOUNTER — Telehealth: Payer: Self-pay | Admitting: Internal Medicine

## 2019-02-21 DIAGNOSIS — E785 Hyperlipidemia, unspecified: Secondary | ICD-10-CM

## 2019-02-21 MED ORDER — ATORVASTATIN CALCIUM 40 MG PO TABS: 40.0000 mg | ORAL_TABLET | Freq: Every day | ORAL | 1 refills | Status: DC

## 2019-02-21 NOTE — Telephone Encounter (Signed)
Medication Refill - Medication: atorvastatin (LIPITOR) 40 MG tablet [[id number]]    Has the patient contacted their pharmacy? No. (Agent: If no, request that the patient contact the pharmacy for the refill.) (Agent: If yes, when and what did the pharmacy advise?)  Preferred Pharmacy (with phone number or street name):  [place], [place] [phone] (Phone) [phone] (Fax)     Agent: Please be advised that RX refills may take up to 3 business days. We ask that you follow-up with your pharmacy.

## 2019-04-21 ENCOUNTER — Other Ambulatory Visit: Payer: Self-pay

## 2019-04-21 ENCOUNTER — Encounter: Payer: Self-pay | Admitting: Internal Medicine

## 2019-04-21 ENCOUNTER — Ambulatory Visit (INDEPENDENT_AMBULATORY_CARE_PROVIDER_SITE_OTHER): Payer: BC Managed Care – PPO | Admitting: Internal Medicine

## 2019-04-21 ENCOUNTER — Other Ambulatory Visit: Payer: Self-pay | Admitting: Internal Medicine

## 2019-04-21 ENCOUNTER — Other Ambulatory Visit (INDEPENDENT_AMBULATORY_CARE_PROVIDER_SITE_OTHER): Payer: BC Managed Care – PPO

## 2019-04-21 VITALS — BP 136/78 | HR 83 | Temp 98.4°F | Ht 63.0 in | Wt 216.0 lb

## 2019-04-21 DIAGNOSIS — N183 Chronic kidney disease, stage 3 unspecified: Secondary | ICD-10-CM

## 2019-04-21 DIAGNOSIS — Z1231 Encounter for screening mammogram for malignant neoplasm of breast: Secondary | ICD-10-CM

## 2019-04-21 DIAGNOSIS — D473 Essential (hemorrhagic) thrombocythemia: Secondary | ICD-10-CM

## 2019-04-21 DIAGNOSIS — D72829 Elevated white blood cell count, unspecified: Secondary | ICD-10-CM

## 2019-04-21 DIAGNOSIS — D75839 Thrombocytosis, unspecified: Secondary | ICD-10-CM

## 2019-04-21 DIAGNOSIS — I10 Essential (primary) hypertension: Secondary | ICD-10-CM

## 2019-04-21 LAB — CBC WITH DIFFERENTIAL/PLATELET
Basophils Absolute: 0 10*3/uL (ref 0.0–0.1)
Basophils Relative: 0.3 % (ref 0.0–3.0)
Eosinophils Absolute: 0.1 10*3/uL (ref 0.0–0.7)
Eosinophils Relative: 0.4 % (ref 0.0–5.0)
HCT: 38.6 % (ref 36.0–46.0)
Hemoglobin: 12.1 g/dL (ref 12.0–15.0)
Lymphocytes Relative: 15.6 % (ref 12.0–46.0)
Lymphs Abs: 2.5 10*3/uL (ref 0.7–4.0)
MCHC: 31.5 g/dL (ref 30.0–36.0)
MCV: 81.1 fl (ref 78.0–100.0)
Monocytes Absolute: 1 10*3/uL (ref 0.1–1.0)
Monocytes Relative: 6.3 % (ref 3.0–12.0)
Neutro Abs: 12.3 10*3/uL — ABNORMAL HIGH (ref 1.4–7.7)
Neutrophils Relative %: 77.4 % — ABNORMAL HIGH (ref 43.0–77.0)
Platelets: 385 10*3/uL (ref 150.0–400.0)
RBC: 4.75 Mil/uL (ref 3.87–5.11)
RDW: 14.5 % (ref 11.5–15.5)
WBC: 15.8 10*3/uL — ABNORMAL HIGH (ref 4.0–10.5)

## 2019-04-21 LAB — BASIC METABOLIC PANEL
BUN: 14 mg/dL (ref 6–23)
CO2: 27 mEq/L (ref 19–32)
Calcium: 9.6 mg/dL (ref 8.4–10.5)
Chloride: 101 mEq/L (ref 96–112)
Creatinine, Ser: 1.25 mg/dL — ABNORMAL HIGH (ref 0.40–1.20)
GFR: 52.37 mL/min — ABNORMAL LOW (ref >60.00)
Glucose, Bld: 106 mg/dL — ABNORMAL HIGH (ref 70–99)
Potassium: 4 mEq/L (ref 3.5–5.1)
Sodium: 136 mEq/L (ref 135–145)

## 2019-04-21 MED ORDER — CHLORTHALIDONE 25 MG PO TABS: 25.0000 mg | ORAL_TABLET | Freq: Every day | ORAL | 1 refills | Status: DC

## 2019-04-21 MED ORDER — VERAPAMIL HCL ER 240 MG PO TBCR: 240.0000 mg | EXTENDED_RELEASE_TABLET | Freq: Every day | ORAL | 1 refills | Status: DC

## 2019-04-21 NOTE — Patient Instructions (Signed)

## 2019-04-21 NOTE — Progress Notes (Signed)
Subjective:  Patient ID: Holly Fowler, female    DOB: [birth year]  Age: 63 y.o. MRN: [id number]  CC: Hypertension   HPI TAWSHA VANGORP presents for f/up - She tells me her blood pressure has been well controlled.  Unfortunately she has not been able to stop smoking or work on her lifestyle modifications.  She denies any recent episodes of headache, blurred vision, chest pain, shortness of breath, palpitations, edema, or fatigue.  Outpatient Medications Prior to Visit  Medication Sig Dispense Refill  . atorvastatin (LIPITOR) 40 MG tablet Take 1 tablet (40 mg total) by mouth daily. 90 tablet 1  . levocetirizine (XYZAL) 5 MG tablet Take 1 tablet (5 mg total) by mouth every evening. 90 tablet 3  . triamcinolone (NASACORT AQ) 55 MCG/ACT AERO nasal inhaler Place 2 sprays into the nose 2 (two) times daily. 3 Inhaler 3  . triamcinolone ointment (KENALOG) 0.5 % Apply 1 application topically 2 (two) times daily. 60 g 2  . chlorthalidone (HYGROTON) 25 MG tablet Take 1 tablet (25 mg total) by mouth daily. 90 tablet 1  . verapamil (CALAN-SR) 240 MG CR tablet Take 1 tablet (240 mg total) by mouth at bedtime. 90 tablet 1   No facility-administered medications prior to visit.     ROS Review of Systems  Constitutional: Positive for unexpected weight change (wt gain). Negative for diaphoresis and fatigue.  HENT: Negative.   Eyes: Negative for visual disturbance.  Respiratory: Negative for cough, chest tightness, shortness of breath and wheezing.   Cardiovascular: Negative for chest pain, palpitations and leg swelling.  Gastrointestinal: Negative for abdominal pain, constipation, diarrhea, nausea and vomiting.  Endocrine: Negative.   Genitourinary: Negative.  Negative for difficulty urinating.  Musculoskeletal: Negative.  Negative for arthralgias and myalgias.  Skin: Negative.  Negative for color change and pallor.  Hematological: Negative for adenopathy. Does not bruise/bleed easily.   Psychiatric/Behavioral: Negative.     Objective:  BP 136/78 (BP Location: Left Arm, Patient Position: Sitting, Cuff Size: Large)   Pulse 83   Temp 98.4 F (36.9 C) (Oral)   Ht 5\' 3"  (1.6 m)   Wt 216 lb (98 kg)   SpO2 97%   BMI 38.26 kg/m   BP Readings from Last 3 Encounters:  04/21/19 136/78  11/11/18 130/76  11/09/18 130/70    Wt Readings from Last 3 Encounters:  04/21/19 216 lb (98 kg)  11/11/18 214 lb 1.3 oz (97.1 kg)  11/09/18 214 lb (97.1 kg)    Physical Exam  Lab Results  Component Value Date   WBC 15.8 (H) 04/21/2019   HGB 12.1 04/21/2019   HCT 38.6 04/21/2019   PLT 385.0 04/21/2019   GLUCOSE 106 (H) 04/21/2019   CHOL 117 10/21/2018   TRIG 78.0 10/21/2018   HDL 40.00 10/21/2018   LDLCALC 61 10/21/2018   ALT 11 08/13/2017   AST 11 08/13/2017   NA 136 04/21/2019   K 4.0 04/21/2019   CL 101 04/21/2019   CREATININE 1.25 (H) 04/21/2019   BUN 14 04/21/2019   CO2 27 04/21/2019   TSH 2.27 10/21/2018   HGBA1C 5.5 10/21/2018    No results found.  Assessment & Plan:   Cozetta was seen today for hypertension.  Diagnoses and all orders for this visit:  Essential hypertension- She has not quite achieved her blood pressure goal of 130/80.  I have asked her to stay on the current dose of chlorthalidone and verapamil.  I have asked her to improve her lifestyle  modifications and to try to quit smoking. -     Basic metabolic panel; Future -     chlorthalidone (HYGROTON) 25 MG tablet; Take 1 tablet (25 mg total) by mouth daily. -     verapamil (CALAN-SR) 240 MG CR tablet; Take 1 tablet (240 mg total) by mouth at bedtime.  Thrombocytosis ([place])- Her platelet count is normal now. -     CBC with Differential/Platelet; Future -     Protein electrophoresis, serum; Future  Leukocytosis, unspecified type- Her white cell count remains elevated, she is not anemic, her other cell lines and platelet count are all normal.  This is likely related to tobacco abuse. -      Protein electrophoresis, serum; Future  Visit for screening mammogram -     MM DIGITAL SCREENING BILATERAL; Future  Kidney disease, chronic, stage III (GFR 30-59 ml/min) ([place])- Her renal function is stable.  She will avoid nephrotoxic agents and will maintain good control of her blood pressure.   I am having Merced D. Gookin maintain her levocetirizine, triamcinolone, triamcinolone ointment, atorvastatin, chlorthalidone, and verapamil.  Meds ordered this encounter  Medications  . chlorthalidone (HYGROTON) 25 MG tablet    Sig: Take 1 tablet (25 mg total) by mouth daily.    Dispense:  90 tablet    Refill:  1  . verapamil (CALAN-SR) 240 MG CR tablet    Sig: Take 1 tablet (240 mg total) by mouth at bedtime.    Dispense:  90 tablet    Refill:  1     Follow-up: Return in about 6 months (around 10/22/2019).  [name], MD

## 2019-04-25 ENCOUNTER — Other Ambulatory Visit: Payer: Self-pay | Admitting: Internal Medicine

## 2019-04-25 LAB — PROTEIN ELECTROPHORESIS, SERUM
Albumin ELP: 3.5 g/dL — ABNORMAL LOW (ref 3.8–4.8)
Alpha 1: 0.4 g/dL — ABNORMAL HIGH (ref 0.2–0.3)
Alpha 2: 1 g/dL — ABNORMAL HIGH (ref 0.5–0.9)
Beta 2: 0.5 g/dL (ref 0.2–0.5)
Beta Globulin: 0.5 g/dL (ref 0.4–0.6)
Gamma Globulin: 1 g/dL (ref 0.8–1.7)
Total Protein: 6.9 g/dL (ref 6.1–8.1)

## 2019-09-10 ENCOUNTER — Other Ambulatory Visit: Payer: Self-pay | Admitting: Internal Medicine

## 2019-09-10 DIAGNOSIS — L309 Dermatitis, unspecified: Secondary | ICD-10-CM

## 2019-10-20 ENCOUNTER — Ambulatory Visit: Payer: BC Managed Care – PPO | Admitting: Internal Medicine

## 2019-10-29 ENCOUNTER — Other Ambulatory Visit: Payer: Self-pay | Admitting: Internal Medicine

## 2019-10-29 DIAGNOSIS — I10 Essential (primary) hypertension: Secondary | ICD-10-CM

## 2019-11-01 ENCOUNTER — Telehealth: Payer: Self-pay | Admitting: *Deleted

## 2019-11-01 ENCOUNTER — Other Ambulatory Visit: Payer: Self-pay

## 2019-11-01 ENCOUNTER — Encounter: Payer: Self-pay | Admitting: Internal Medicine

## 2019-11-01 ENCOUNTER — Ambulatory Visit (INDEPENDENT_AMBULATORY_CARE_PROVIDER_SITE_OTHER): Payer: BC Managed Care – PPO | Admitting: Internal Medicine

## 2019-11-01 VITALS — BP 134/70 | HR 84 | Temp 98.0°F | Ht 63.0 in | Wt 213.2 lb

## 2019-11-01 DIAGNOSIS — N1831 Chronic kidney disease, stage 3a: Secondary | ICD-10-CM | POA: Diagnosis not present

## 2019-11-01 DIAGNOSIS — I10 Essential (primary) hypertension: Secondary | ICD-10-CM

## 2019-11-01 DIAGNOSIS — E785 Hyperlipidemia, unspecified: Secondary | ICD-10-CM

## 2019-11-01 DIAGNOSIS — Z0000 Encounter for general adult medical examination without abnormal findings: Secondary | ICD-10-CM | POA: Diagnosis not present

## 2019-11-01 DIAGNOSIS — D72829 Elevated white blood cell count, unspecified: Secondary | ICD-10-CM

## 2019-11-01 DIAGNOSIS — Z1231 Encounter for screening mammogram for malignant neoplasm of breast: Secondary | ICD-10-CM

## 2019-11-01 LAB — URINALYSIS, ROUTINE W REFLEX MICROSCOPIC
Bilirubin Urine: NEGATIVE
Hgb urine dipstick: NEGATIVE
Ketones, ur: NEGATIVE
Leukocytes,Ua: NEGATIVE
Nitrite: NEGATIVE
RBC / HPF: NONE SEEN (ref 0–?)
Specific Gravity, Urine: 1.02 (ref 1.000–1.030)
Total Protein, Urine: NEGATIVE
Urine Glucose: NEGATIVE
Urobilinogen, UA: 1 (ref 0.0–1.0)
WBC, UA: NONE SEEN (ref 0–?)
pH: 5.5 (ref 5.0–8.0)

## 2019-11-01 LAB — HEPATIC FUNCTION PANEL
ALT: 13 U/L (ref 0–35)
AST: 15 U/L (ref 0–37)
Albumin: 4.1 g/dL (ref 3.5–5.2)
Alkaline Phosphatase: 182 U/L — ABNORMAL HIGH (ref 39–117)
Bilirubin, Direct: 0.1 mg/dL (ref 0.0–0.3)
Total Bilirubin: 0.6 mg/dL (ref 0.2–1.2)
Total Protein: 7.7 g/dL (ref 6.0–8.3)

## 2019-11-01 LAB — BASIC METABOLIC PANEL
BUN: 15 mg/dL (ref 6–23)
CO2: 28 mEq/L (ref 19–32)
Calcium: 9.7 mg/dL (ref 8.4–10.5)
Chloride: 100 mEq/L (ref 96–112)
Creatinine, Ser: 1.32 mg/dL — ABNORMAL HIGH (ref 0.40–1.20)
GFR: 49.09 mL/min — ABNORMAL LOW (ref >60.00)
Glucose, Bld: 111 mg/dL — ABNORMAL HIGH (ref 70–99)
Potassium: 3.6 mEq/L (ref 3.5–5.1)
Sodium: 136 mEq/L (ref 135–145)

## 2019-11-01 LAB — TSH: TSH: 1.31 u[IU]/mL (ref 0.35–4.50)

## 2019-11-01 LAB — LIPID PANEL
Cholesterol: 138 mg/dL (ref 0–200)
HDL: 35.7 mg/dL — ABNORMAL LOW (ref >39.00)
LDL Cholesterol: 82 mg/dL (ref 0–99)
NonHDL: 101.95
Total CHOL/HDL Ratio: 4
Triglycerides: 99 mg/dL (ref 0.0–149.0)
VLDL: 19.8 mg/dL (ref 0.0–40.0)

## 2019-11-01 NOTE — Progress Notes (Signed)
Subjective:  Patient ID: Holly Fowler, female    DOB: 09-25-1955  Age: 64 y.o. MRN: [id number]  CC: Annual Exam, Hypertension, and Hyperlipidemia   This visit occurred during the SARS-CoV-2 public health emergency.  Safety protocols were in place, including screening questions prior to the visit, additional usage of staff PPE, and extensive cleaning of exam room while observing appropriate contact time as indicated for disinfecting solutions.    HPI Holly Fowler presents for a CPX.  She tells me her blood pressure has been well controlled recently.  She is very active and denies any recent episodes of CP, DOE, palpitations, edema, or fatigue.  She is tolerating all of her medications well with no side effects.  Outpatient Medications Prior to Visit  Medication Sig Dispense Refill  . atorvastatin (LIPITOR) 40 MG tablet Take 1 tablet (40 mg total) by mouth daily. 90 tablet 1  . levocetirizine (XYZAL) 5 MG tablet Take 1 tablet (5 mg total) by mouth every evening. 90 tablet 3  . triamcinolone (NASACORT AQ) 55 MCG/ACT AERO nasal inhaler Place 2 sprays into the nose 2 (two) times daily. 3 Inhaler 3  . triamcinolone ointment (KENALOG) 0.5 % APPLY OINTMENT TOPICALLY TWICE DAILY 60 g 0  . verapamil (CALAN-SR) 240 MG CR tablet TAKE 1 TABLET BY MOUTH AT BEDTIME 90 tablet 0  . chlorthalidone (HYGROTON) 25 MG tablet Take 1 tablet (25 mg total) by mouth daily. 90 tablet 1   No facility-administered medications prior to visit.    ROS Review of Systems  Constitutional: Negative.  Negative for chills, diaphoresis, fatigue and fever.  HENT: Negative.   Eyes: Negative.   Respiratory: Negative for cough, chest tightness, shortness of breath and wheezing.   Cardiovascular: Negative for chest pain, palpitations and leg swelling.  Gastrointestinal: Negative for abdominal pain, constipation, diarrhea, nausea and vomiting.  Endocrine: Negative.   Genitourinary: Negative.  Negative for difficulty  urinating, dysuria and hematuria.  Musculoskeletal: Negative for arthralgias and myalgias.  Skin: Negative.  Negative for color change and pallor.  Neurological: Negative for dizziness, weakness and light-headedness.  Hematological: Negative for adenopathy. Does not bruise/bleed easily.  Psychiatric/Behavioral: Negative.     Objective:  BP 134/70 (BP Location: Left Arm, Patient Position: Sitting, Cuff Size: Large)   Pulse 84   Temp 98 F (36.7 C) (Oral)   Ht 5\' 3"  (1.6 m)   Wt 213 lb 4 oz (96.7 kg)   SpO2 98%   BMI 37.78 kg/m   BP Readings from Last 3 Encounters:  11/01/19 134/70  04/21/19 136/78  11/11/18 130/76    Wt Readings from Last 3 Encounters:  11/01/19 213 lb 4 oz (96.7 kg)  04/21/19 216 lb (98 kg)  11/11/18 214 lb 1.3 oz (97.1 kg)    Physical Exam Vitals reviewed.  Constitutional:      Appearance: Normal appearance.  HENT:     Nose: Nose normal.     Mouth/Throat:     Mouth: Mucous membranes are moist.  Eyes:     General: No scleral icterus.    Conjunctiva/sclera: Conjunctivae normal.  Cardiovascular:     Rate and Rhythm: Normal rate.     Heart sounds: No murmur.     Comments: EKG -  NSR  Rightward axis No LVH Rightward axis No change from the prior EKG  Pulmonary:     Effort: Pulmonary effort is normal.     Breath sounds: No stridor. No wheezing, rhonchi or rales.  Abdominal:  General: Abdomen is protuberant. Bowel sounds are normal. There is no distension.     Palpations: Abdomen is soft. There is no hepatomegaly, splenomegaly or mass.     Tenderness: There is no abdominal tenderness.  Musculoskeletal:        General: Normal range of motion.     Cervical back: Neck supple.     Right lower leg: No edema.     Left lower leg: No edema.  Lymphadenopathy:     Cervical: No cervical adenopathy.  Skin:    General: Skin is warm and dry.     Coloration: Skin is not pale.  Neurological:     General: No focal deficit present.     Mental  Status: She is alert and oriented to person, place, and time.  Psychiatric:        Mood and Affect: Mood normal.        Behavior: Behavior normal.     Lab Results  Component Value Date   WBC 11.7 (H) 11/02/2019   HGB 12.8 11/02/2019   HCT 39.8 11/02/2019   PLT 370.0 11/02/2019   GLUCOSE 111 (H) 11/01/2019   CHOL 138 11/01/2019   TRIG 99.0 11/01/2019   HDL 35.70 (L) 11/01/2019   LDLCALC 82 11/01/2019   ALT 13 11/01/2019   AST 15 11/01/2019   NA 136 11/01/2019   K 3.6 11/01/2019   CL 100 11/01/2019   CREATININE 1.32 (H) 11/01/2019   BUN 15 11/01/2019   CO2 28 11/01/2019   TSH 1.31 11/01/2019   HGBA1C 5.5 10/21/2018    No results found.  Assessment & Plan:   Koleen was seen today for annual exam, hypertension and hyperlipidemia.  Diagnoses and all orders for this visit:  Essential hypertension- Her blood pressure is adequately well controlled.  Electrolytes are normal.  Renal function is stable.  Will continue the current antihypertensives. -     Basic metabolic panel -     CBC with Differential/Platelet -     TSH -     Urinalysis, Routine w reflex microscopic -     EKG 12-Lead -     chlorthalidone (HYGROTON) 25 MG tablet; Take 1 tablet (25 mg total) by mouth daily.  Stage 3a chronic kidney disease- Her renal function is stable.  I have asked her to avoid nephrotoxic agents and to quit smoking.  Will continue to try to maintain control of her blood pressure. -     Basic metabolic panel -     Urinalysis, Routine w reflex microscopic  Leukocytosis, unspecified type- This is a chronic finding for her.  Her white cell count today is actually lower than it was previously.  Her other cell lines are normal.  This is likely benign and related to tobacco abuse.  I have asked her to try to quit smoking. -     CBC with Differential/Platelet  Hyperlipidemia with target LDL less than 100- She has achieved her LDL goal is doing well on the statin. -     Hepatic function panel -      TSH  Routine general medical examination at a health care facility- Exam completed, labs reviewed, she refused a flu vaccine, cervical cancer screening is up-to-date, colon cancer screening is up-to-date, she is referred for breast cancer screening. -     Lipid panel  Visit for screening mammogram -     MM DIGITAL SCREENING BILATERAL; Future   I am having Scotty D. Flythe maintain her levocetirizine, triamcinolone, atorvastatin,  triamcinolone ointment, verapamil, and chlorthalidone.  Meds ordered this encounter  Medications  . chlorthalidone (HYGROTON) 25 MG tablet    Sig: Take 1 tablet (25 mg total) by mouth daily.    Dispense:  90 tablet    Refill:  1     Follow-up: Return in about 6 months (around 05/03/2020).  [name], MD

## 2019-11-01 NOTE — Telephone Encounter (Signed)
I called pt- per [facility] we need 1 lav top tube for pt's CBC. There was a insufficient specimen sent on 11/01/19 Pt informed and asked to go to [facility] for re-draw. Patient's states she will go back this afternoon or possibly tomorrow 11/02/19.

## 2019-11-01 NOTE — Patient Instructions (Signed)
Health Maintenance, Female Adopting a healthy lifestyle and getting preventive care are important in promoting health and wellness. Ask your health care provider about:  The right schedule for you to have regular tests and exams.  Things you can do on your own to prevent diseases and keep yourself healthy. What should I know about diet, weight, and exercise? Eat a healthy diet   Eat a diet that includes plenty of vegetables, fruits, low-fat dairy products, and lean protein.  Do not eat a lot of foods that are high in solid fats, added sugars, or sodium. Maintain a healthy weight Body mass index (BMI) is used to identify weight problems. It estimates body fat based on height and weight. Your health care provider can help determine your BMI and help you achieve or maintain a healthy weight. Get regular exercise Get regular exercise. This is one of the most important things you can do for your health. Most adults should:  Exercise for at least 150 minutes each week. The exercise should increase your heart rate and make you sweat (moderate-intensity exercise).  Do strengthening exercises at least twice a week. This is in addition to the moderate-intensity exercise.  Spend less time sitting. Even light physical activity can be beneficial. Watch cholesterol and blood lipids Have your blood tested for lipids and cholesterol at 64 years of age, then have this test every 5 years. Have your cholesterol levels checked more often if:  Your lipid or cholesterol levels are high.  You are older than 64 years of age.  You are at high risk for heart disease. What should I know about cancer screening? Depending on your health history and family history, you may need to have cancer screening at various ages. This may include screening for:  Breast cancer.  Cervical cancer.  Colorectal cancer.  Skin cancer.  Lung cancer. What should I know about heart disease, diabetes, and high blood  pressure? Blood pressure and heart disease  High blood pressure causes heart disease and increases the risk of stroke. This is more likely to develop in people who have high blood pressure readings, are of African descent, or are overweight.  Have your blood pressure checked: ? Every 3-5 years if you are 18-39 years of age. ? Every year if you are 40 years old or older. Diabetes Have regular diabetes screenings. This checks your fasting blood sugar level. Have the screening done:  Once every three years after age 40 if you are at a normal weight and have a low risk for diabetes.  More often and at a younger age if you are overweight or have a high risk for diabetes. What should I know about preventing infection? Hepatitis B If you have a higher risk for hepatitis B, you should be screened for this virus. Talk with your health care provider to find out if you are at risk for hepatitis B infection. Hepatitis C Testing is recommended for:  Everyone born from 1945 through 1965.  Anyone with known risk factors for hepatitis C. Sexually transmitted infections (STIs)  Get screened for STIs, including gonorrhea and chlamydia, if: ? You are sexually active and are younger than 64 years of age. ? You are older than 64 years of age and your health care provider tells you that you are at risk for this type of infection. ? Your sexual activity has changed since you were last screened, and you are at increased risk for chlamydia or gonorrhea. Ask your health care provider if   you are at risk.  Ask your health care provider about whether you are at high risk for HIV. Your health care provider may recommend a prescription medicine to help prevent HIV infection. If you choose to take medicine to prevent HIV, you should first get tested for HIV. You should then be tested every 3 months for as long as you are taking the medicine. Pregnancy  If you are about to stop having your period (premenopausal) and  you may become pregnant, seek counseling before you get pregnant.  Take 400 to 800 micrograms (mcg) of folic acid every day if you become pregnant.  Ask for birth control (contraception) if you want to prevent pregnancy. Osteoporosis and menopause Osteoporosis is a disease in which the bones lose minerals and strength with aging. This can result in bone fractures. If you are 65 years old or older, or if you are at risk for osteoporosis and fractures, ask your health care provider if you should:  Be screened for bone loss.  Take a calcium or vitamin D supplement to lower your risk of fractures.  Be given hormone replacement therapy (HRT) to treat symptoms of menopause. Follow these instructions at home: Lifestyle  Do not use any products that contain nicotine or tobacco, such as cigarettes, e-cigarettes, and chewing tobacco. If you need help quitting, ask your health care provider.  Do not use street drugs.  Do not share needles.  Ask your health care provider for help if you need support or information about quitting drugs. Alcohol use  Do not drink alcohol if: ? Your health care provider tells you not to drink. ? You are pregnant, may be pregnant, or are planning to become pregnant.  If you drink alcohol: ? Limit how much you use to 0-1 drink a day. ? Limit intake if you are breastfeeding.  Be aware of how much alcohol is in your drink. In the [place] one drink equals one 12 oz bottle of beer (355 mL), one 5 oz glass of wine (148 mL), or one 1 oz glass of hard liquor (44 mL). General instructions  Schedule regular health, dental, and eye exams.  Stay current with your vaccines.  Tell your health care provider if: ? You often feel depressed. ? You have ever been abused or do not feel safe at home. Summary  Adopting a healthy lifestyle and getting preventive care are important in promoting health and wellness.  Follow your health care provider's instructions about healthy  diet, exercising, and getting tested or screened for diseases.  Follow your health care provider's instructions on monitoring your cholesterol and blood pressure. This information is not intended to replace advice given to you by your health care provider. Make sure you discuss any questions you have with your health care provider. Document Revised: 08/04/2018 Document Reviewed: 08/04/2018 Elsevier Patient Education  2020 [employer]  

## 2019-11-02 ENCOUNTER — Encounter: Payer: Self-pay | Admitting: Internal Medicine

## 2019-11-02 ENCOUNTER — Other Ambulatory Visit (INDEPENDENT_AMBULATORY_CARE_PROVIDER_SITE_OTHER): Payer: BC Managed Care – PPO

## 2019-11-02 ENCOUNTER — Other Ambulatory Visit: Payer: Self-pay | Admitting: Internal Medicine

## 2019-11-02 DIAGNOSIS — I10 Essential (primary) hypertension: Secondary | ICD-10-CM

## 2019-11-02 LAB — CBC WITH DIFFERENTIAL/PLATELET
Basophils Absolute: 0.1 10*3/uL (ref 0.0–0.1)
Basophils Relative: 0.8 % (ref 0.0–3.0)
Eosinophils Absolute: 0.1 10*3/uL (ref 0.0–0.7)
Eosinophils Relative: 0.7 % (ref 0.0–5.0)
HCT: 39.8 % (ref 36.0–46.0)
Hemoglobin: 12.8 g/dL (ref 12.0–15.0)
Lymphocytes Relative: 18.5 % (ref 12.0–46.0)
Lymphs Abs: 2.2 10*3/uL (ref 0.7–4.0)
MCHC: 32.2 g/dL (ref 30.0–36.0)
MCV: 81.5 fl (ref 78.0–100.0)
Monocytes Absolute: 0.8 10*3/uL (ref 0.1–1.0)
Monocytes Relative: 6.5 % (ref 3.0–12.0)
Neutro Abs: 8.6 10*3/uL — ABNORMAL HIGH (ref 1.4–7.7)
Neutrophils Relative %: 73.5 % (ref 43.0–77.0)
Platelets: 370 10*3/uL (ref 150.0–400.0)
RBC: 4.89 Mil/uL (ref 3.87–5.11)
RDW: 13.9 % (ref 11.5–15.5)
WBC: 11.7 10*3/uL — ABNORMAL HIGH (ref 4.0–10.5)

## 2019-11-02 MED ORDER — CHLORTHALIDONE 25 MG PO TABS: 25.0000 mg | ORAL_TABLET | Freq: Every day | ORAL | 1 refills | Status: DC

## 2019-12-06 ENCOUNTER — Ambulatory Visit: Payer: BC Managed Care – PPO

## 2019-12-06 ENCOUNTER — Other Ambulatory Visit: Payer: Self-pay

## 2019-12-06 ENCOUNTER — Ambulatory Visit
Admission: RE | Admit: 2019-12-06 | Discharge: 2019-12-06 | Disposition: A | Discharge status: Home / Self Care | Payer: BC Managed Care – PPO | Source: Ambulatory Visit | Attending: Internal Medicine | Admitting: Internal Medicine

## 2019-12-06 DIAGNOSIS — Z1231 Encounter for screening mammogram for malignant neoplasm of breast: Secondary | ICD-10-CM

## 2019-12-06 LAB — HM MAMMOGRAPHY

## 2019-12-07 IMAGING — DX DG HIP (WITH OR WITHOUT PELVIS) 5+V BILAT
4 series · 4 of 4 positions shown · non-contrast
Comparison: None.

CLINICAL DATA: Bilateral hip pain with stiffness

EXAM:
DG HIP (WITH OR WITHOUT PELVIS) 5+V BILAT

[hip ap (1 of 2)]
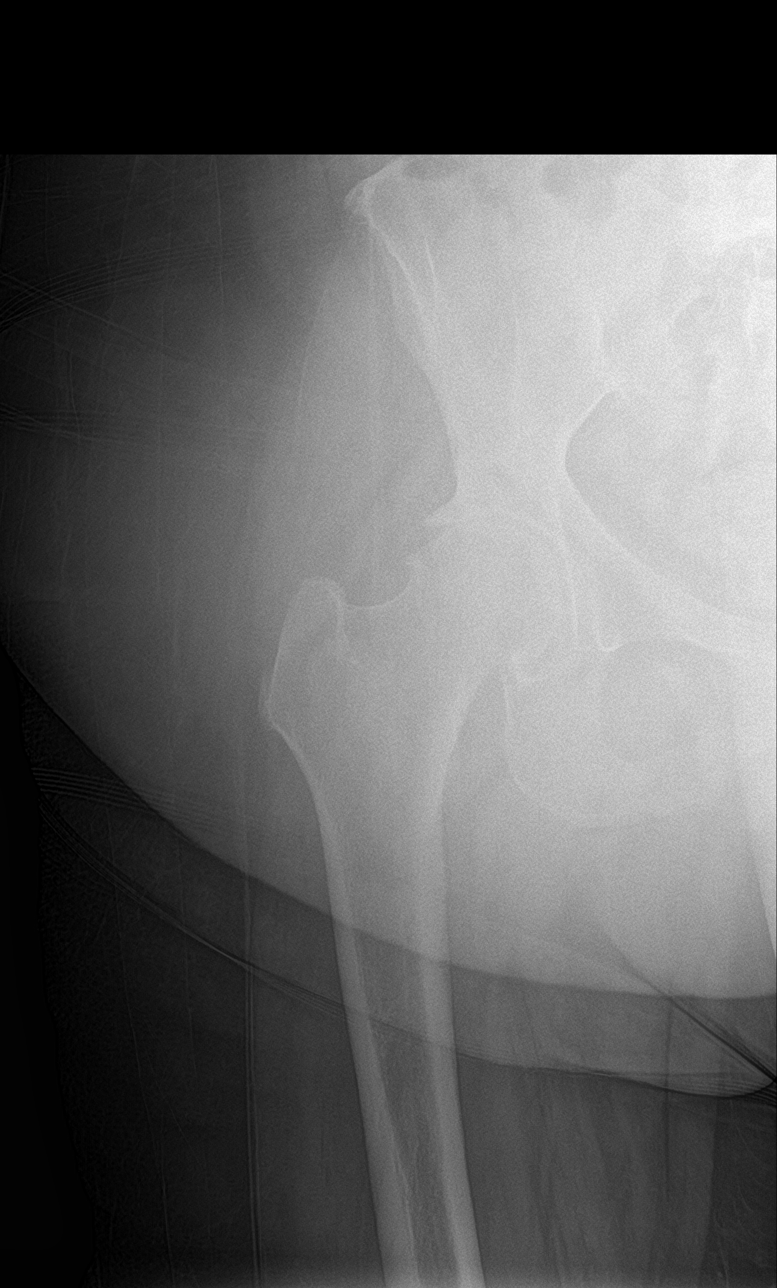

[hip ap (2 of 2)]
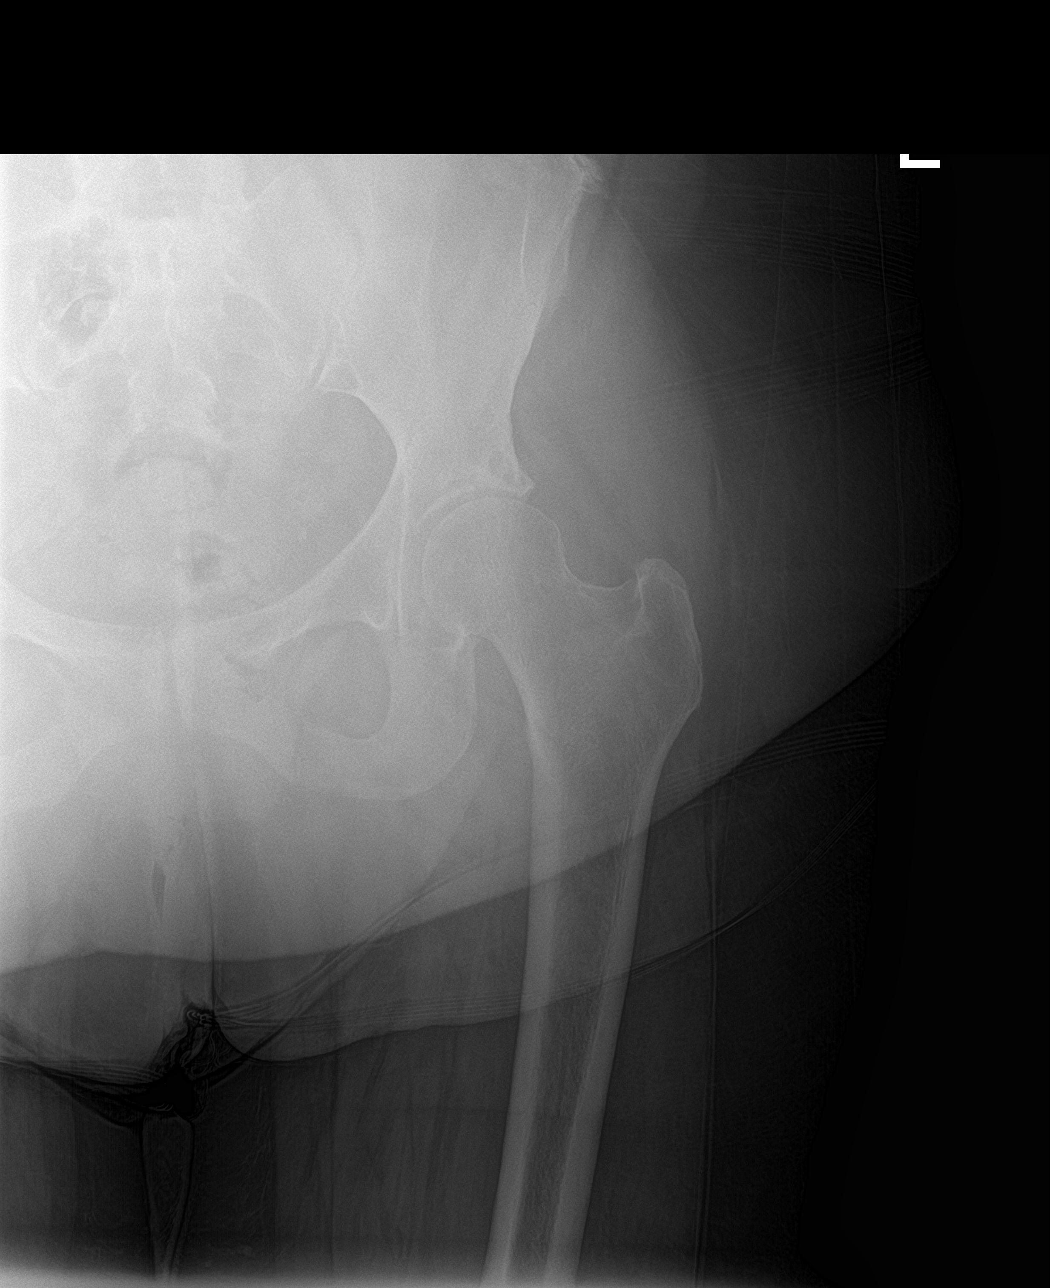

[hip lat (1 of 2)]
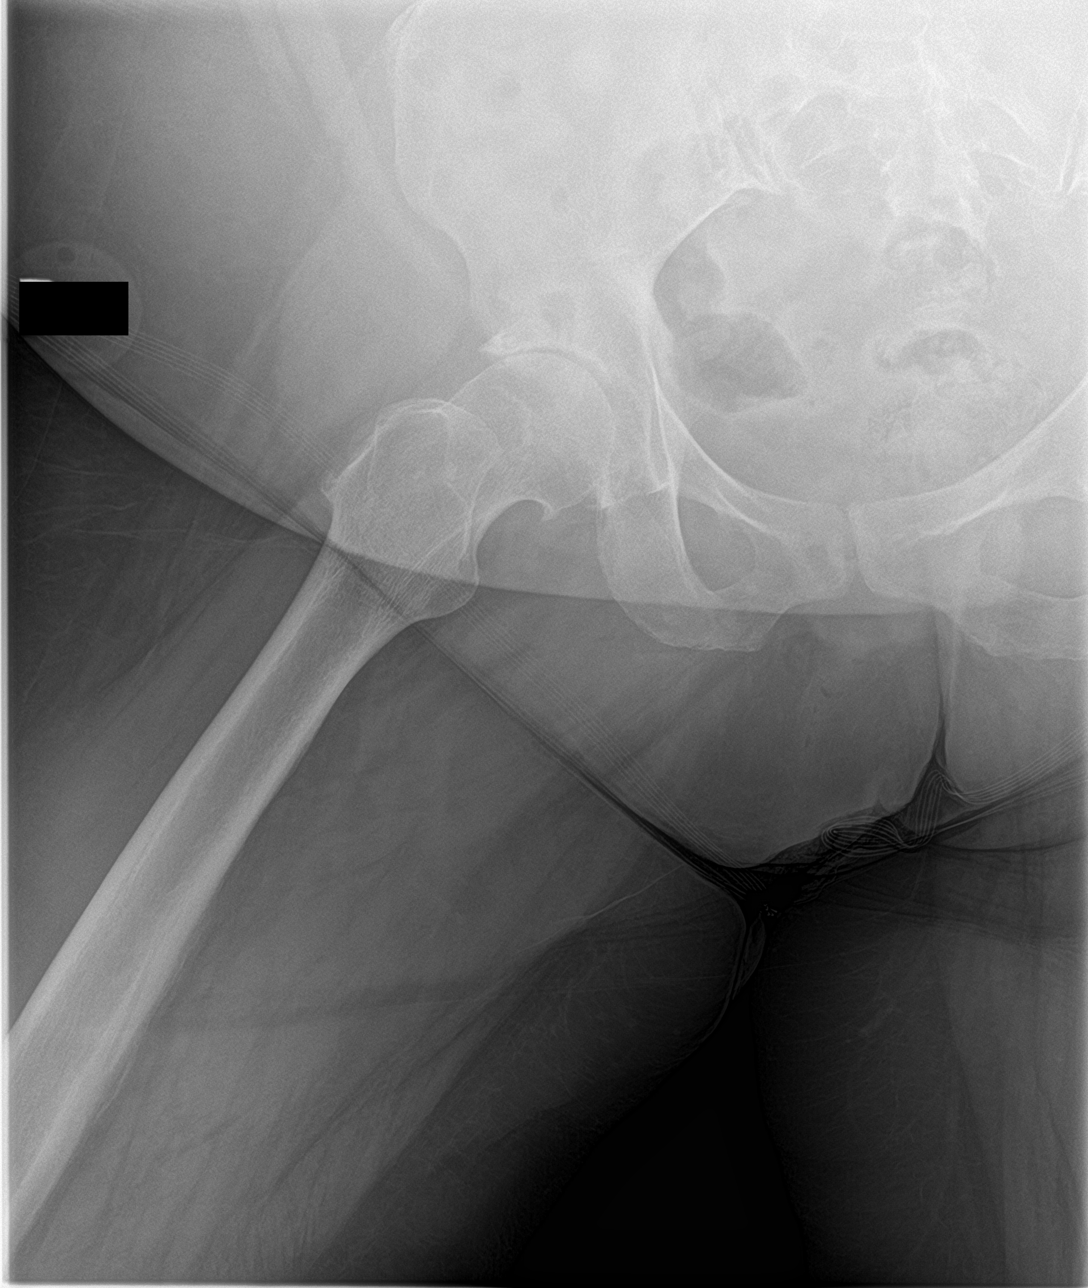

[hip lat (2 of 2)]
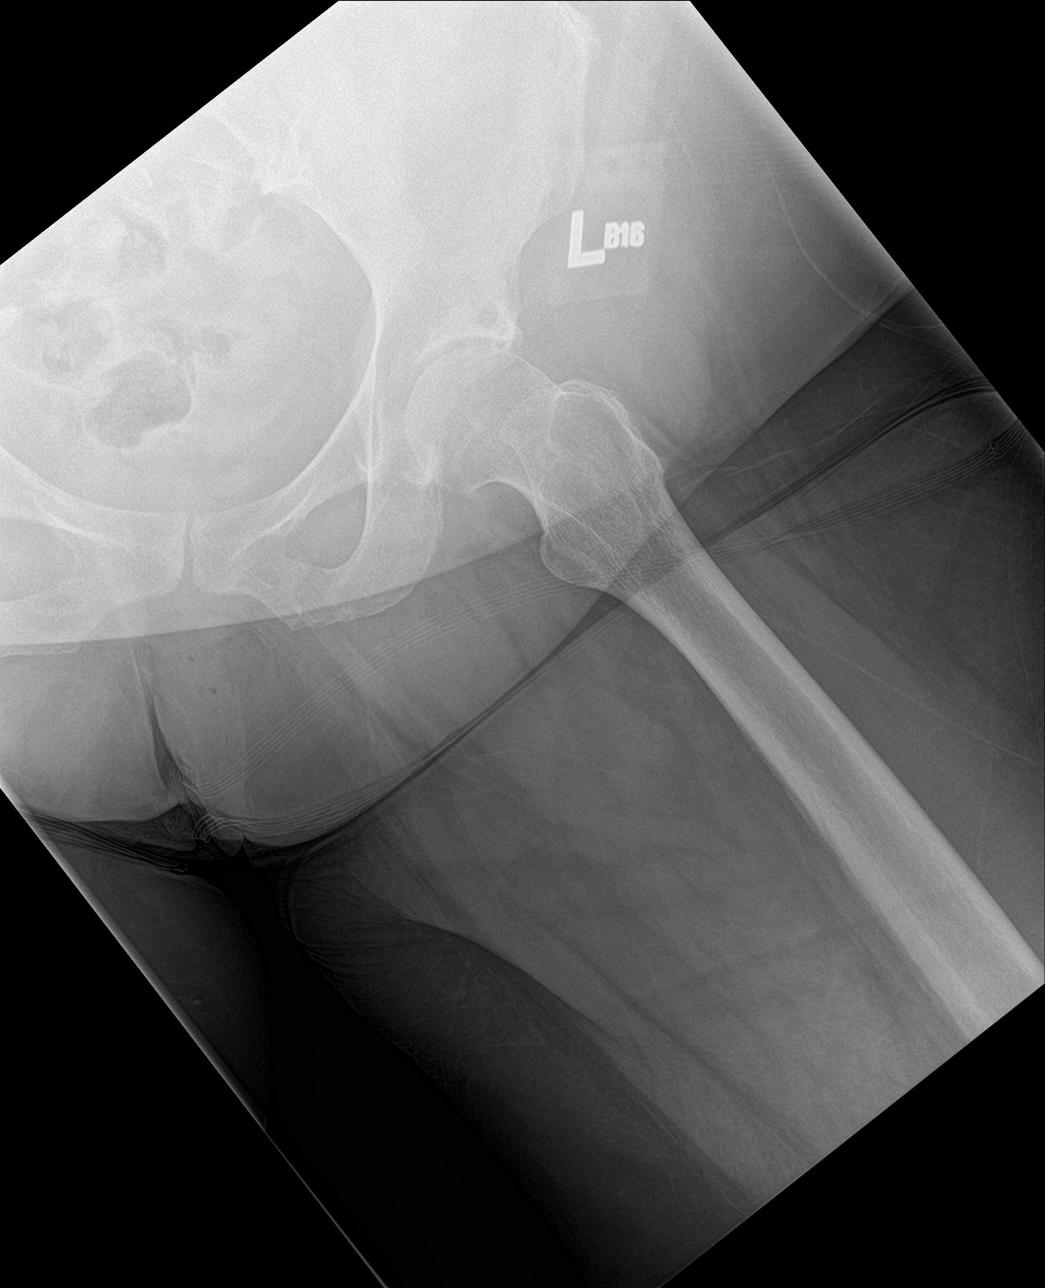

[4 of 4 positions shown; findings below may reference images not displayed]

FINDINGS: Moderate to advanced degenerative change in the right hip joint with
joint space narrowing and spurring. Negative for fracture or AVN

Mild to moderate degenerative change left hip with mild joint space
narrowing. Subchondral cyst in the acetabular roof. No fracture or
AVN.
IMPRESSION: Osteoarthritis of both hips, right greater than left. No acute
abnormality.

## 2020-01-29 ENCOUNTER — Other Ambulatory Visit: Payer: Self-pay | Admitting: Internal Medicine

## 2020-01-29 DIAGNOSIS — I10 Essential (primary) hypertension: Secondary | ICD-10-CM

## 2020-02-19 ENCOUNTER — Ambulatory Visit
Admission: EM | Admit: 2020-02-19 | Discharge: 2020-02-19 | Disposition: A | Discharge status: Home / Self Care | Payer: BC Managed Care – PPO | Attending: Physician Assistant | Admitting: Physician Assistant

## 2020-02-19 ENCOUNTER — Other Ambulatory Visit: Payer: Self-pay

## 2020-02-19 DIAGNOSIS — M25551 Pain in right hip: Secondary | ICD-10-CM

## 2020-02-19 MED ORDER — PREDNISONE 10 MG (21) PO TBPK: ORAL_TABLET | Freq: Every day | ORAL | 0 refills | Status: DC

## 2020-02-19 NOTE — ED Triage Notes (Signed)
Patient is here with a c/o right hip pain that occurs off/on greater than one week. She states pain radiates down into her knee, and worse with climbing steps.

## 2020-02-19 NOTE — Discharge Instructions (Signed)
Start prednisone as directed. Follow up with orthopedics as scheduled for further evaluation.

## 2020-02-19 NOTE — ED Provider Notes (Signed)
[facility]    CSN: [id number] Arrival date & time: 02/19/20  1018      History   Chief Complaint Chief Complaint  Patient presents with  . Hip Pain    HPI Holly Fowler is a 65 y.o. female.   64 year old female comes in with worsening chronic right hip pain.  Patient with known arthritis to bilateral hip right greater than left.  Denies new injury/trauma.  However, for the past 1 to 2 weeks, has had increased pain, especially with range of motion/climbing stairs.  Now pain radiates to the knee during movements.  Denies numbness, tingling.  Takes Tylenol/Aleve intermittently with some relief.     Past Medical History:  Diagnosis Date  . Hypertension     Patient Active Problem List   Diagnosis Date Noted  . Thrombocytosis ([place]) 10/21/2018  . Chronic hip pain, bilateral 10/21/2018  . Primary osteoarthritis of both hips 10/21/2018  . Allergic rhinitis due to allergen 10/20/2016  . Hyperglycemia 03/13/2016  . Tobacco abuse disorder 03/13/2016  . Visit for screening mammogram 03/13/2016  . Leukocytosis 08/21/2015  . Hyperlipidemia with target LDL less than 100 02/20/2015  . Kidney disease, chronic, stage III (GFR 30-59 ml/min) 08/29/2014  . Routine general medical examination at a health care facility 02/17/2014  . Eczema 01/11/2013  . Obesity, unspecified 01/11/2013  . Smoker unmotivated to quit 01/11/2013  . Essential hypertension 01/11/2013    Past Surgical History:  Procedure Laterality Date  . ADENOIDECTOMY  1971    OB History   No obstetric history on file.      Home Medications    Prior to Admission medications   Medication Sig Start Date End Date Taking? Authorizing Provider  atorvastatin (LIPITOR) 40 MG tablet Take 1 tablet (40 mg total) by mouth daily. 02/21/19   Janith Lima, MD  chlorthalidone (HYGROTON) 25 MG tablet Take 1 tablet (25 mg total) by mouth daily. 11/02/19   Janith Lima, MD  levocetirizine (XYZAL) 5 MG tablet Take  1 tablet (5 mg total) by mouth every evening. 10/20/16   Janith Lima, MD  predniSONE (STERAPRED UNI-PAK 21 TAB) 10 MG (21) TBPK tablet Take by mouth daily. Take 6 tabs by mouth day 1, then 5 tabs, then 4 tabs, then 3 tabs, 2 tabs, then 1 tab for the last day 02/19/20   Tasia Catchings, [place] Sima V, PA-C  triamcinolone (NASACORT AQ) 55 MCG/ACT AERO nasal inhaler Place 2 sprays into the nose 2 (two) times daily. 10/20/16   Janith Lima, MD  triamcinolone ointment (KENALOG) 0.5 % APPLY OINTMENT TOPICALLY TWICE DAILY 09/10/19   Janith Lima, MD  verapamil (CALAN-SR) 240 MG CR tablet TAKE 1 TABLET BY MOUTH AT BEDTIME 01/29/20   Janith Lima, MD  triamcinolone ointment (KENALOG) 0.5 % Apply 1 application topically 2 (two) times daily. 07/13/18   Janith Lima, MD    Family History Family History  Problem Relation Age of Onset  . Hypertension Mother   . Hyperlipidemia Mother   . Hypertension Father   . Hyperlipidemia Father   . Hypertension Sister   . Hyperlipidemia Sister   . Hypertension Brother   . Hyperlipidemia Brother   . Cancer Neg Hx   . Diabetes Neg Hx   . Stroke Neg Hx   . Early death Neg Hx   . Heart disease Neg Hx   . Kidney disease Neg Hx     Social History Social History   Tobacco Use  .  Smoking status: Current Every Day Smoker    Packs/day: 0.50    Years: 40.00    Pack years: 20.00    Types: Cigarettes  . Smokeless tobacco: Never Used  Substance Use Topics  . Alcohol use: No  . Drug use: No     Allergies   Patient has no known allergies.   Review of Systems Review of Systems  Reason unable to perform ROS: See HPI as above.     Physical Exam Triage Vital Signs ED Triage Vitals  Enc Vitals Group     BP 02/19/20 1059 (!) 141/84     Pulse Rate 02/19/20 1059 85     Resp 02/19/20 1059 16     Temp 02/19/20 1059 98.5 F (36.9 C)     Temp Source 02/19/20 1059 Oral     SpO2 02/19/20 1059 98 %     Weight --      Height --      Head Circumference --      Peak Flow  --      Pain Score 02/19/20 1125 7     Pain Loc --      Pain Edu? --      Excl. in [place]? --    No data found.  Updated Vital Signs BP (!) 141/84 (BP Location: Left Arm)   Pulse 85   Temp 98.5 F (36.9 C) (Oral)   Resp 16   SpO2 98%   Physical Exam Constitutional:      General: She is not in acute distress.    Appearance: Normal appearance. She is well-developed. She is not toxic-appearing or diaphoretic.  HENT:     Head: Normocephalic and atraumatic.  Eyes:     Conjunctiva/sclera: Conjunctivae normal.     Pupils: Pupils are equal, round, and reactive to light.  Pulmonary:     Effort: Pulmonary effort is normal. No respiratory distress.     Comments: Speaking in full sentences without difficulty Musculoskeletal:     Cervical back: Normal range of motion and neck supple.     Comments: No tenderness to palpation of the back.  Tenderness to palpation of the right hip.  No tenderness to palpation of the leg.  Full range of motion of hip, with more pain during eversion. Sensation intact, pedal pulse 2+. Able to ambulate on own without difficulty.   Skin:    General: Skin is warm and dry.  Neurological:     Mental Status: She is alert and oriented to person, place, and time.    UC Treatments / Results  Labs (all labs ordered are listed, but only abnormal results are displayed) Labs Reviewed - No data to display  EKG   Radiology No results found.  Procedures Procedures (including critical care time)  Medications Ordered in UC Medications - No data to display  Initial Impression / Assessment and Plan / UC Course  I have reviewed the triage vital signs and the nursing notes.  Pertinent labs & imaging results that were available during my care of the patient were reviewed by me and considered in my medical decision making (see chart for details).    Patient with known arthritis to the right hip, no injuries.  Will start prednisone as directed.  Patient already with  orthopedic appointment in 1 week, to follow-up as scheduled for reevaluation and further management needed.  Return precautions given.  Final Clinical Impressions(s) / UC Diagnoses   Final diagnoses:  Right hip pain  ED Prescriptions    Medication Sig Dispense Auth. Provider   predniSONE (STERAPRED UNI-PAK 21 TAB) 10 MG (21) TBPK tablet Take by mouth daily. Take 6 tabs by mouth day 1, then 5 tabs, then 4 tabs, then 3 tabs, 2 tabs, then 1 tab for the last day 21 tablet Tasia Catchings, Taylinn Brabant V, PA-C     I have reviewed the PDMP during this encounter.   Ok Edwards, PA-C 02/19/20 1211

## 2020-03-09 ENCOUNTER — Other Ambulatory Visit: Payer: Self-pay | Admitting: Internal Medicine

## 2020-03-09 DIAGNOSIS — E785 Hyperlipidemia, unspecified: Secondary | ICD-10-CM

## 2020-04-07 HISTORY — PX: TOTAL HIP ARTHROPLASTY: SHX124

## 2020-05-01 ENCOUNTER — Other Ambulatory Visit: Payer: Self-pay | Admitting: Internal Medicine

## 2020-05-01 DIAGNOSIS — I10 Essential (primary) hypertension: Secondary | ICD-10-CM

## 2020-05-15 ENCOUNTER — Ambulatory Visit: Payer: BC Managed Care – PPO | Admitting: Internal Medicine

## 2020-05-22 ENCOUNTER — Encounter: Payer: Self-pay | Admitting: Internal Medicine

## 2020-05-22 ENCOUNTER — Other Ambulatory Visit: Payer: Self-pay

## 2020-05-22 ENCOUNTER — Ambulatory Visit (INDEPENDENT_AMBULATORY_CARE_PROVIDER_SITE_OTHER): Payer: BC Managed Care – PPO | Admitting: Internal Medicine

## 2020-05-22 VITALS — BP 152/78 | HR 79 | Temp 97.6°F | Resp 16 | Ht 63.0 in | Wt 207.0 lb

## 2020-05-22 DIAGNOSIS — H6121 Impacted cerumen, right ear: Secondary | ICD-10-CM | POA: Diagnosis not present

## 2020-05-22 DIAGNOSIS — I10 Essential (primary) hypertension: Secondary | ICD-10-CM

## 2020-05-22 DIAGNOSIS — Z23 Encounter for immunization: Secondary | ICD-10-CM | POA: Diagnosis not present

## 2020-05-22 DIAGNOSIS — N1831 Chronic kidney disease, stage 3a: Secondary | ICD-10-CM

## 2020-05-22 DIAGNOSIS — L2084 Intrinsic (allergic) eczema: Secondary | ICD-10-CM | POA: Diagnosis not present

## 2020-05-22 MED ORDER — INDAPAMIDE 1.25 MG PO TABS: 1.2500 mg | ORAL_TABLET | Freq: Every day | ORAL | 0 refills | Status: DC

## 2020-05-22 MED ORDER — TRIAMCINOLONE ACETONIDE 0.5 % EX CREA: 1.0000 "application " | TOPICAL_CREAM | Freq: Three times a day (TID) | CUTANEOUS | 3 refills | Status: DC

## 2020-05-22 NOTE — Progress Notes (Signed)
Patient consent obtained. Irrigation with water and peroxide performed. Full view of tympanic membranes after procedure.  Patient tolerated procedure well.   

## 2020-05-22 NOTE — Progress Notes (Signed)
Subjective:  Patient ID: Holly Fowler, female    DOB: [birth year]  Age: 64 y.o. MRN: [id number]  CC: Rash and Hypertension  This visit occurred during the SARS-CoV-2 public health emergency.  Safety protocols were in place, including screening questions prior to the visit, additional usage of staff PPE, and extensive cleaning of exam room while observing appropriate contact time as indicated for disinfecting solutions.    HPI LEEBA Fowler presents for f/up -   1.  She complains of a several week history of discomfort in her right ear with a decreased level of hearing. 2.  She complains of chronic, intermittent pruritis rash on both upper extremities.  She is not aware of any exposures or contacts. 3.  She is not taking chlorthalidone.  She said when she took it she felt dizzy and like her blood pressure was too low.  Since she stopped taking it she has had none of those symptoms.  She has not been working on her lifestyle modifications because she recently had hip surgery.  She denies headache, blurred vision, chest pain, shortness of breath, palpitations, edema, or fatigue.  Outpatient Medications Prior to Visit  Medication Sig Dispense Refill  . atorvastatin (LIPITOR) 40 MG tablet Take 1 tablet by mouth once daily 90 tablet 1  . levocetirizine (XYZAL) 5 MG tablet Take 1 tablet (5 mg total) by mouth every evening. 90 tablet 3  . triamcinolone (NASACORT AQ) 55 MCG/ACT AERO nasal inhaler Place 2 sprays into the nose 2 (two) times daily. 3 Inhaler 3  . verapamil (CALAN-SR) 240 MG CR tablet TAKE 1 TABLET BY MOUTH AT BEDTIME 90 tablet 0  . chlorthalidone (HYGROTON) 25 MG tablet Take 1 tablet (25 mg total) by mouth daily. 90 tablet 1  . predniSONE (STERAPRED UNI-PAK 21 TAB) 10 MG (21) TBPK tablet Take by mouth daily. Take 6 tabs by mouth day 1, then 5 tabs, then 4 tabs, then 3 tabs, 2 tabs, then 1 tab for the last day 21 tablet 0  . triamcinolone ointment (KENALOG) 0.5 % APPLY OINTMENT TOPICALLY  TWICE DAILY 60 g 0   No facility-administered medications prior to visit.    ROS Review of Systems  Constitutional: Negative for appetite change, diaphoresis, fatigue and unexpected weight change.  HENT: Positive for hearing loss. Negative for congestion, ear discharge, ear pain, facial swelling, rhinorrhea, sinus pressure, sinus pain, sore throat and trouble swallowing.   Eyes: Negative.   Respiratory: Negative for cough, chest tightness, shortness of breath and wheezing.   Cardiovascular: Negative for chest pain, palpitations and leg swelling.  Gastrointestinal: Negative for abdominal pain, blood in stool, constipation, diarrhea, nausea and vomiting.  Endocrine: Negative.   Genitourinary: Negative.  Negative for difficulty urinating.  Musculoskeletal: Positive for arthralgias. Negative for myalgias and neck pain.  Skin: Positive for rash. Negative for color change and pallor.  Neurological: Negative.  Negative for dizziness, weakness and light-headedness.  Hematological: Negative for adenopathy. Does not bruise/bleed easily.  Psychiatric/Behavioral: Negative.     Objective:  BP (!) 152/78   Pulse 79   Temp 97.6 F (36.4 C) (Oral)   Resp 16   Ht 5\' 3"  (1.6 m)   Wt 207 lb (93.9 kg)   SpO2 98%   BMI 36.67 kg/m   BP Readings from Last 3 Encounters:  05/22/20 (!) 152/78  02/19/20 (!) 141/84  11/01/19 134/70    Wt Readings from Last 3 Encounters:  05/22/20 207 lb (93.9 kg)  11/01/19 213 lb 4  oz (96.7 kg)  04/21/19 216 lb (98 kg)    Physical Exam Vitals reviewed.  Constitutional:      Appearance: She is obese.  HENT:     Right Ear: Tympanic membrane and external ear normal. Decreased hearing noted. No swelling or tenderness. There is impacted cerumen.     Left Ear: Hearing, tympanic membrane, ear canal and external ear normal.     Ears:     Comments: I put Colace in her right EAC and then I irrigated it with water and used an ear pick to remove the cerumen.  She  tolerated this well.  All of her symptoms have resolved afterwards.  The examination of the TM and EAC is normal afterwards.    Nose: Nose normal.     Mouth/Throat:     Mouth: Mucous membranes are moist.  Eyes:     General: No scleral icterus.    Conjunctiva/sclera: Conjunctivae normal.  Cardiovascular:     Rate and Rhythm: Normal rate and regular rhythm.     Heart sounds: No murmur heard.   Pulmonary:     Effort: Pulmonary effort is normal.     Breath sounds: No stridor. No wheezing, rhonchi or rales.  Abdominal:     General: Abdomen is protuberant. Bowel sounds are normal. There is no distension.     Palpations: There is no hepatomegaly or mass.  Musculoskeletal:        General: Normal range of motion.     Cervical back: Neck supple.     Right lower leg: No edema.     Left lower leg: No edema.  Lymphadenopathy:     Cervical: No cervical adenopathy.  Skin:    General: Skin is warm and dry.     Coloration: Skin is not pale.     Findings: Rash present. Rash is papular and scaling.     Comments: There are scaly papules scattered on both upper extremities on the dorsal surfaces of the upper arm and lower arm.  Neurological:     General: No focal deficit present.     Mental Status: She is alert.  Psychiatric:        Mood and Affect: Mood normal.        Behavior: Behavior normal.     Lab Results  Component Value Date   WBC 13.4 (H) 05/22/2020   HGB 11.8 05/22/2020   HCT 38.1 05/22/2020   PLT 369 05/22/2020   GLUCOSE 99 05/22/2020   CHOL 138 11/01/2019   TRIG 99.0 11/01/2019   HDL 35.70 (L) 11/01/2019   LDLCALC 82 11/01/2019   ALT 13 11/01/2019   AST 15 11/01/2019   NA 139 05/22/2020   K 4.0 05/22/2020   CL 105 05/22/2020   CREATININE 1.14 (H) 05/22/2020   BUN 14 05/22/2020   CO2 26 05/22/2020   TSH 1.31 11/01/2019   HGBA1C 5.5 10/21/2018    No results found.  Assessment & Plan:   Daphne was seen today for rash and hypertension.  Diagnoses and all orders  for this visit:  Essential hypertension- Her blood pressure is not adequately well controlled.  She did not tolerate chlorthalidone.  Her renal function has improved slightly.  Her electrolytes are normal.  We will try indapamide. -     CBC with Differential/Platelet; Future -     BASIC METABOLIC PANEL WITH GFR; Future -     indapamide (LOZOL) 1.25 MG tablet; Take 1 tablet (1.25 mg total) by mouth daily. -  BASIC METABOLIC PANEL WITH GFR -     CBC with Differential/Platelet  Intrinsic eczema -     triamcinolone cream (KENALOG) 0.5 %; Apply 1 application topically 3 (three) times daily.  Hearing loss of right ear due to cerumen impaction- Symptoms resolved after the cerumen was removed.  Stage 3a chronic kidney disease ([place])- Improvement noted.  Will try to get better control of her blood pressure and I have asked her to quit smoking.  Other orders -     Flu Vaccine QUAD 6+ mos PF IM (Fluarix Quad PF)   I have discontinued Vallarie Mare D. Hodsdon's triamcinolone ointment, chlorthalidone, and predniSONE. I am also having her start on triamcinolone cream and indapamide. Additionally, I am having her maintain her levocetirizine, triamcinolone, atorvastatin, and verapamil.  Meds ordered this encounter  Medications  . triamcinolone cream (KENALOG) 0.5 %    Sig: Apply 1 application topically 3 (three) times daily.    Dispense:  30 g    Refill:  3  . indapamide (LOZOL) 1.25 MG tablet    Sig: Take 1 tablet (1.25 mg total) by mouth daily.    Dispense:  90 tablet    Refill:  0     Follow-up: Return in about 3 months (around 08/21/2020).  [name], MD

## 2020-05-22 NOTE — Patient Instructions (Signed)

## 2020-05-23 LAB — BASIC METABOLIC PANEL WITH GFR
BUN/Creatinine Ratio: 12 (calc) (ref 6–22)
BUN: 14 mg/dL (ref 7–25)
CO2: 26 mmol/L (ref 20–32)
Calcium: 9.8 mg/dL (ref 8.6–10.4)
Chloride: 105 mmol/L (ref 98–110)
Creat: 1.14 mg/dL — ABNORMAL HIGH (ref 0.50–0.99)
GFR, Est African American: 59 mL/min/{1.73_m2} — ABNORMAL LOW (ref >=60)
GFR, Est Non African American: 51 mL/min/{1.73_m2} — ABNORMAL LOW (ref >=60)
Glucose, Bld: 99 mg/dL (ref 65–99)
Potassium: 4 mmol/L (ref 3.5–5.3)
Sodium: 139 mmol/L (ref 135–146)

## 2020-05-23 LAB — CBC WITH DIFFERENTIAL/PLATELET
Absolute Monocytes: 898 cells/uL (ref 200–950)
Basophils Absolute: 27 cells/uL (ref 0–200)
Basophils Relative: 0.2 %
Eosinophils Absolute: 80 cells/uL (ref 15–500)
Eosinophils Relative: 0.6 %
HCT: 38.1 % (ref 35.0–45.0)
Hemoglobin: 11.8 g/dL (ref 11.7–15.5)
Lymphs Abs: 2184 cells/uL (ref 850–3900)
MCH: 25.7 pg — ABNORMAL LOW (ref 27.0–33.0)
MCHC: 31 g/dL — ABNORMAL LOW (ref 32.0–36.0)
MCV: 82.8 fL (ref 80.0–100.0)
MPV: 10 fL (ref 7.5–12.5)
Monocytes Relative: 6.7 %
Neutro Abs: 10211 cells/uL — ABNORMAL HIGH (ref 1500–7800)
Neutrophils Relative %: 76.2 %
Platelets: 369 10*3/uL (ref 140–400)
RBC: 4.6 10*6/uL (ref 3.80–5.10)
RDW: 13.1 % (ref 11.0–15.0)
Total Lymphocyte: 16.3 %
WBC: 13.4 10*3/uL — ABNORMAL HIGH (ref 3.8–10.8)

## 2020-08-02 ENCOUNTER — Other Ambulatory Visit: Payer: Self-pay | Admitting: Internal Medicine

## 2020-08-02 DIAGNOSIS — I10 Essential (primary) hypertension: Secondary | ICD-10-CM

## 2020-08-16 ENCOUNTER — Other Ambulatory Visit: Payer: Self-pay | Admitting: Internal Medicine

## 2020-08-16 DIAGNOSIS — I10 Essential (primary) hypertension: Secondary | ICD-10-CM

## 2020-09-23 ENCOUNTER — Other Ambulatory Visit: Payer: Self-pay | Admitting: Internal Medicine

## 2020-09-23 DIAGNOSIS — E785 Hyperlipidemia, unspecified: Secondary | ICD-10-CM

## 2020-11-21 ENCOUNTER — Other Ambulatory Visit: Payer: Self-pay | Admitting: Internal Medicine

## 2020-11-21 DIAGNOSIS — I10 Essential (primary) hypertension: Secondary | ICD-10-CM

## 2021-02-05 ENCOUNTER — Other Ambulatory Visit: Payer: Self-pay | Admitting: Internal Medicine

## 2021-02-05 DIAGNOSIS — I10 Essential (primary) hypertension: Secondary | ICD-10-CM

## 2021-02-19 ENCOUNTER — Other Ambulatory Visit: Payer: Self-pay

## 2021-02-19 ENCOUNTER — Encounter: Payer: Self-pay | Admitting: Internal Medicine

## 2021-02-19 ENCOUNTER — Ambulatory Visit (INDEPENDENT_AMBULATORY_CARE_PROVIDER_SITE_OTHER): Payer: BC Managed Care – PPO | Admitting: Internal Medicine

## 2021-02-19 VITALS — BP 132/76 | HR 66 | Temp 98.4°F | Resp 16 | Ht 63.0 in | Wt 202.0 lb

## 2021-02-19 DIAGNOSIS — I10 Essential (primary) hypertension: Secondary | ICD-10-CM

## 2021-02-19 DIAGNOSIS — N1831 Chronic kidney disease, stage 3a: Secondary | ICD-10-CM | POA: Diagnosis not present

## 2021-02-19 DIAGNOSIS — Z23 Encounter for immunization: Secondary | ICD-10-CM | POA: Diagnosis not present

## 2021-02-19 DIAGNOSIS — Z0000 Encounter for general adult medical examination without abnormal findings: Secondary | ICD-10-CM

## 2021-02-19 DIAGNOSIS — D539 Nutritional anemia, unspecified: Secondary | ICD-10-CM | POA: Insufficient documentation

## 2021-02-19 DIAGNOSIS — D72829 Elevated white blood cell count, unspecified: Secondary | ICD-10-CM | POA: Diagnosis not present

## 2021-02-19 DIAGNOSIS — Z720 Tobacco use: Secondary | ICD-10-CM

## 2021-02-19 DIAGNOSIS — E785 Hyperlipidemia, unspecified: Secondary | ICD-10-CM

## 2021-02-19 LAB — HEPATIC FUNCTION PANEL
ALT: 9 U/L (ref 0–35)
AST: 12 U/L (ref 0–37)
Albumin: 4.2 g/dL (ref 3.5–5.2)
Alkaline Phosphatase: 178 U/L — ABNORMAL HIGH (ref 39–117)
Bilirubin, Direct: 0.1 mg/dL (ref 0.0–0.3)
Total Bilirubin: 0.7 mg/dL (ref 0.2–1.2)
Total Protein: 7.3 g/dL (ref 6.0–8.3)

## 2021-02-19 LAB — URINALYSIS, ROUTINE W REFLEX MICROSCOPIC
Bilirubin Urine: NEGATIVE
Hgb urine dipstick: NEGATIVE
Ketones, ur: NEGATIVE
Leukocytes,Ua: NEGATIVE
Nitrite: NEGATIVE
RBC / HPF: NONE SEEN (ref 0–?)
Specific Gravity, Urine: 1.01 (ref 1.000–1.030)
Total Protein, Urine: NEGATIVE
Urine Glucose: NEGATIVE
Urobilinogen, UA: 1 (ref 0.0–1.0)
WBC, UA: NONE SEEN (ref 0–?)
pH: 6 (ref 5.0–8.0)

## 2021-02-19 LAB — CBC WITH DIFFERENTIAL/PLATELET
Basophils Absolute: 0.1 10*3/uL (ref 0.0–0.1)
Basophils Relative: 0.4 % (ref 0.0–3.0)
Eosinophils Absolute: 0.1 10*3/uL (ref 0.0–0.7)
Eosinophils Relative: 0.5 % (ref 0.0–5.0)
HCT: 36.9 % (ref 36.0–46.0)
Hemoglobin: 11.8 g/dL — ABNORMAL LOW (ref 12.0–15.0)
Lymphocytes Relative: 22.9 % (ref 12.0–46.0)
Lymphs Abs: 3.2 10*3/uL (ref 0.7–4.0)
MCHC: 32.1 g/dL (ref 30.0–36.0)
MCV: 80.2 fl (ref 78.0–100.0)
Monocytes Absolute: 0.9 10*3/uL (ref 0.1–1.0)
Monocytes Relative: 6.3 % (ref 3.0–12.0)
Neutro Abs: 9.7 10*3/uL — ABNORMAL HIGH (ref 1.4–7.7)
Neutrophils Relative %: 69.9 % (ref 43.0–77.0)
Platelets: 339 10*3/uL (ref 150.0–400.0)
RBC: 4.6 Mil/uL (ref 3.87–5.11)
RDW: 14.2 % (ref 11.5–15.5)
WBC: 13.9 10*3/uL — ABNORMAL HIGH (ref 4.0–10.5)

## 2021-02-19 LAB — LIPID PANEL
Cholesterol: 127 mg/dL (ref 0–200)
HDL: 39.2 mg/dL (ref >39.00)
LDL Cholesterol: 73 mg/dL (ref 0–99)
NonHDL: 87.91
Total CHOL/HDL Ratio: 3
Triglycerides: 75 mg/dL (ref 0.0–149.0)
VLDL: 15 mg/dL (ref 0.0–40.0)

## 2021-02-19 LAB — BASIC METABOLIC PANEL
BUN: 16 mg/dL (ref 6–23)
CO2: 27 mEq/L (ref 19–32)
Calcium: 9.6 mg/dL (ref 8.4–10.5)
Chloride: 101 mEq/L (ref 96–112)
Creatinine, Ser: 1.11 mg/dL (ref 0.40–1.20)
GFR: 52.39 mL/min — ABNORMAL LOW (ref >60.00)
Glucose, Bld: 93 mg/dL (ref 70–99)
Potassium: 4.1 mEq/L (ref 3.5–5.1)
Sodium: 136 mEq/L (ref 135–145)

## 2021-02-19 LAB — TSH: TSH: 1.09 u[IU]/mL (ref 0.35–4.50)

## 2021-02-19 MED ORDER — ATORVASTATIN CALCIUM 40 MG PO TABS: 40.0000 mg | ORAL_TABLET | Freq: Every day | ORAL | 1 refills | Status: DC

## 2021-02-19 MED ORDER — INDAPAMIDE 1.25 MG PO TABS: 1.2500 mg | ORAL_TABLET | Freq: Every day | ORAL | 0 refills | Status: DC

## 2021-02-19 NOTE — Progress Notes (Signed)
Subjective:  Patient ID: Holly Fowler, female    DOB: May 01, 1956  Age: 65 y.o. MRN: [id number]  CC: Hypertension and Annual Exam  This visit occurred during the SARS-CoV-2 public health emergency.  Safety protocols were in place, including screening questions prior to the visit, additional usage of staff PPE, and extensive cleaning of exam room while observing appropriate contact time as indicated for disinfecting solutions.    HPI [name] presents for a CPX and f/up -   She tells me her blood pressure has been well controlled.  She is active and denies any recent episodes of headache, blurred vision, chest pain, shortness of breath, edema, dizziness, or lightheadedness.  Outpatient Medications Prior to Visit  Medication Sig Dispense Refill   levocetirizine (XYZAL) 5 MG tablet Take 1 tablet (5 mg total) by mouth every evening. 90 tablet 3   triamcinolone (NASACORT AQ) 55 MCG/ACT AERO nasal inhaler Place 2 sprays into the nose 2 (two) times daily. 3 Inhaler 3   triamcinolone cream (KENALOG) 0.5 % Apply 1 application topically 3 (three) times daily. 30 g 3   verapamil (CALAN-SR) 240 MG CR tablet TAKE 1 TABLET BY MOUTH AT BEDTIME 90 tablet 0   atorvastatin (LIPITOR) 40 MG tablet Take 1 tablet by mouth once daily 90 tablet 1   indapamide (LOZOL) 1.25 MG tablet Take 1 tablet by mouth once daily 90 tablet 0   No facility-administered medications prior to visit.    ROS Review of Systems  Constitutional:  Negative for chills, diaphoresis, fatigue and fever.  HENT: Negative.    Eyes: Negative.   Respiratory:  Negative for cough, chest tightness, shortness of breath and wheezing.   Cardiovascular:  Negative for chest pain, palpitations and leg swelling.  Gastrointestinal:  Negative for abdominal pain, constipation, diarrhea and nausea.  Endocrine: Negative.   Genitourinary: Negative.  Negative for difficulty urinating, dysuria and urgency.  Musculoskeletal: Negative.  Negative for  arthralgias and myalgias.  Skin: Negative.  Negative for color change.  Allergic/Immunologic: Negative.   Neurological: Negative.  Negative for dizziness and weakness.  Hematological:  Negative for adenopathy. Does not bruise/bleed easily.   Objective:  BP 132/76 (BP Location: Left Arm, Patient Position: Sitting, Cuff Size: Large)   Pulse 66   Temp 98.4 F (36.9 C) (Oral)   Resp 16   Ht 5\' 3"  (1.6 m)   Wt 202 lb (91.6 kg)   SpO2 100%   BMI 35.78 kg/m   BP Readings from Last 3 Encounters:  02/19/21 132/76  05/22/20 (!) 152/78  02/19/20 (!) 141/84    Wt Readings from Last 3 Encounters:  02/19/21 202 lb (91.6 kg)  05/22/20 207 lb (93.9 kg)  11/01/19 213 lb 4 oz (96.7 kg)    Physical Exam Vitals reviewed.  Constitutional:      Appearance: Normal appearance.  HENT:     Mouth/Throat:     Mouth: Mucous membranes are moist.  Eyes:     General: No scleral icterus.    Conjunctiva/sclera: Conjunctivae normal.  Cardiovascular:     Rate and Rhythm: Normal rate and regular rhythm.     Heart sounds: No murmur heard.   No gallop.     Comments: EKG- NSR, 63 bpm ?LAE  Rightward axis No significant change compared to the the prior EKG Pulmonary:     Effort: Pulmonary effort is normal.     Breath sounds: No stridor. No wheezing, rhonchi or rales.  Abdominal:     General: Abdomen  is flat.     Palpations: There is no mass.     Tenderness: There is no abdominal tenderness. There is no guarding.  Musculoskeletal:     Cervical back: Neck supple.     Right lower leg: No edema.     Left lower leg: No edema.  Lymphadenopathy:     Cervical: No cervical adenopathy.  Skin:    General: Skin is warm and dry.     Coloration: Skin is not pale.  Neurological:     General: No focal deficit present.     Mental Status: She is alert. Mental status is at baseline.  Psychiatric:        Mood and Affect: Mood normal.        Behavior: Behavior normal.    Lab Results  Component Value Date    WBC 13.9 (H) 02/19/2021   HGB 11.8 (L) 02/19/2021   HCT 36.9 02/19/2021   PLT 339.0 02/19/2021   GLUCOSE 93 02/19/2021   CHOL 127 02/19/2021   TRIG 75.0 02/19/2021   HDL 39.20 02/19/2021   LDLCALC 73 02/19/2021   ALT 9 02/19/2021   AST 12 02/19/2021   NA 136 02/19/2021   K 4.1 02/19/2021   CL 101 02/19/2021   CREATININE 1.11 02/19/2021   BUN 16 02/19/2021   CO2 27 02/19/2021   TSH 1.09 02/19/2021   HGBA1C 5.5 10/21/2018    No results found.  Assessment & Plan:   Deletha was seen today for hypertension and annual exam.  Diagnoses and all orders for this visit:  Essential hypertension- Her blood pressure is adequately well controlled. -     CBC with Differential/Platelet; Future -     Basic metabolic panel; Future -     TSH; Future -     Urinalysis, Routine w reflex microscopic; Future -     EKG 12-Lead -     Urinalysis, Routine w reflex microscopic -     TSH -     Basic metabolic panel -     CBC with Differential/Platelet -     indapamide (LOZOL) 1.25 MG tablet; Take 1 tablet (1.25 mg total) by mouth daily.  Stage 3a chronic kidney disease ([place])- Her renal function is stable, her blood pressure is adequately well controlled, she is avoiding nephrotoxic agents. -     CBC with Differential/Platelet; Future -     Basic metabolic panel; Future -     Urinalysis, Routine w reflex microscopic; Future -     Urinalysis, Routine w reflex microscopic -     Basic metabolic panel -     CBC with Differential/Platelet  Hyperlipidemia with target LDL less than 100- LDL goal achieved. Doing well on the statin  -     Lipid panel; Future -     TSH; Future -     Hepatic function panel; Future -     Hepatic function panel -     TSH -     Lipid panel -     atorvastatin (LIPITOR) 40 MG tablet; Take 1 tablet (40 mg total) by mouth daily.  Leukocytosis, unspecified type- This appears benign and consistent with tobacco abuse. -     CBC with Differential/Platelet; Future -     CBC  with Differential/Platelet  Routine general medical examination at a health care facility- Exam completed, labs reviewed, vaccines reviewed and updated, cancer screenings addressed, patient education material was given.  Need for vaccination -     Pneumococcal polysaccharide vaccine  23-valent greater than or equal to 2yo subcutaneous/IM  Tobacco abuse disorder -     Ambulatory Referral for Lung Cancer Scre  Deficiency anemia- I will screen her for vitamin deficiencies. -     Vitamin B12; Future -     Iron; Future -     Vitamin B1; Future -     Folate; Future -     Ferritin; Future -     Reticulocytes; Future  I have changed Laren P. Dia's indapamide and atorvastatin. I am also having her maintain her levocetirizine, triamcinolone, triamcinolone cream, and verapamil.  Meds ordered this encounter  Medications   indapamide (LOZOL) 1.25 MG tablet    Sig: Take 1 tablet (1.25 mg total) by mouth daily.    Dispense:  90 tablet    Refill:  0   atorvastatin (LIPITOR) 40 MG tablet    Sig: Take 1 tablet (40 mg total) by mouth daily.    Dispense:  90 tablet    Refill:  1      Follow-up: Return in about 6 months (around 08/21/2021).  [name], MD

## 2021-02-19 NOTE — Patient Instructions (Signed)
Health Maintenance, Female Adopting a healthy lifestyle and getting preventive care are important in promoting health and wellness. Ask your health care provider about: The right schedule for you to have regular tests and exams. Things you can do on your own to prevent diseases and keep yourself healthy. What should I know about diet, weight, and exercise? Eat a healthy diet  Eat a diet that includes plenty of vegetables, fruits, low-fat dairy products, and lean protein. Do not eat a lot of foods that are high in solid fats, added sugars, or sodium.  Maintain a healthy weight Body mass index (BMI) is used to identify weight problems. It estimates body fat based on height and weight. Your health care provider can help determineyour BMI and help you achieve or maintain a healthy weight. Get regular exercise Get regular exercise. This is one of the most important things you can do for your health. Most adults should: Exercise for at least 150 minutes each week. The exercise should increase your heart rate and make you sweat (moderate-intensity exercise). Do strengthening exercises at least twice a week. This is in addition to the moderate-intensity exercise. Spend less time sitting. Even light physical activity can be beneficial. Watch cholesterol and blood lipids Have your blood tested for lipids and cholesterol at 65 years of age, then havethis test every 5 years. Have your cholesterol levels checked more often if: Your lipid or cholesterol levels are high. You are older than 65 years of age. You are at high risk for heart disease. What should I know about cancer screening? Depending on your health history and family history, you may need to have cancer screening at various ages. This may include screening for: Breast cancer. Cervical cancer. Colorectal cancer. Skin cancer. Lung cancer. What should I know about heart disease, diabetes, and high blood pressure? Blood pressure and heart  disease High blood pressure causes heart disease and increases the risk of stroke. This is more likely to develop in people who have high blood pressure readings, are of African descent, or are overweight. Have your blood pressure checked: Every 3-5 years if you are 18-39 years of age. Every year if you are 40 years old or older. Diabetes Have regular diabetes screenings. This checks your fasting blood sugar level. Have the screening done: Once every three years after age 40 if you are at a normal weight and have a low risk for diabetes. More often and at a younger age if you are overweight or have a high risk for diabetes. What should I know about preventing infection? Hepatitis B If you have a higher risk for hepatitis B, you should be screened for this virus. Talk with your health care provider to find out if you are at risk forhepatitis B infection. Hepatitis C Testing is recommended for: Everyone born from 1945 through 1965. Anyone with known risk factors for hepatitis C. Sexually transmitted infections (STIs) Get screened for STIs, including gonorrhea and chlamydia, if: You are sexually active and are younger than 65 years of age. You are older than 65 years of age and your health care provider tells you that you are at risk for this type of infection. Your sexual activity has changed since you were last screened, and you are at increased risk for chlamydia or gonorrhea. Ask your health care provider if you are at risk. Ask your health care provider about whether you are at high risk for HIV. Your health care provider may recommend a prescription medicine to help   prevent HIV infection. If you choose to take medicine to prevent HIV, you should first get tested for HIV. You should then be tested every 3 months for as long as you are taking the medicine. Pregnancy If you are about to stop having your period (premenopausal) and you may become pregnant, seek counseling before you get  pregnant. Take 400 to 800 micrograms (mcg) of folic acid every day if you become pregnant. Ask for birth control (contraception) if you want to prevent pregnancy. Osteoporosis and menopause Osteoporosis is a disease in which the bones lose minerals and strength with aging. This can result in bone fractures. If you are 65 years old or older, or if you are at risk for osteoporosis and fractures, ask your health care provider if you should: Be screened for bone loss. Take a calcium or vitamin D supplement to lower your risk of fractures. Be given hormone replacement therapy (HRT) to treat symptoms of menopause. Follow these instructions at home: Lifestyle Do not use any products that contain nicotine or tobacco, such as cigarettes, e-cigarettes, and chewing tobacco. If you need help quitting, ask your health care provider. Do not use street drugs. Do not share needles. Ask your health care provider for help if you need support or information about quitting drugs. Alcohol use Do not drink alcohol if: Your health care provider tells you not to drink. You are pregnant, may be pregnant, or are planning to become pregnant. If you drink alcohol: Limit how much you use to 0-1 drink a day. Limit intake if you are breastfeeding. Be aware of how much alcohol is in your drink. In the [place] one drink equals one 12 oz bottle of beer (355 mL), one 5 oz glass of wine (148 mL), or one 1 oz glass of hard liquor (44 mL). General instructions Schedule regular health, dental, and eye exams. Stay current with your vaccines. Tell your health care provider if: You often feel depressed. You have ever been abused or do not feel safe at home. Summary Adopting a healthy lifestyle and getting preventive care are important in promoting health and wellness. Follow your health care provider's instructions about healthy diet, exercising, and getting tested or screened for diseases. Follow your health care provider's  instructions on monitoring your cholesterol and blood pressure. This information is not intended to replace advice given to you by your health care provider. Make sure you discuss any questions you have with your healthcare provider. Document Revised: 08/04/2018 Document Reviewed: 08/04/2018 Elsevier Patient Education  2022 [employer]  

## 2021-03-26 ENCOUNTER — Other Ambulatory Visit: Payer: Self-pay

## 2021-03-26 ENCOUNTER — Other Ambulatory Visit (INDEPENDENT_AMBULATORY_CARE_PROVIDER_SITE_OTHER): Payer: Medicare Other

## 2021-03-26 DIAGNOSIS — D539 Nutritional anemia, unspecified: Secondary | ICD-10-CM

## 2021-03-26 LAB — VITAMIN B12: Vitamin B-12: 280 pg/mL (ref 211–911)

## 2021-03-26 LAB — FOLATE: Folate: 10.9 ng/mL (ref >5.9)

## 2021-03-26 LAB — FERRITIN: Ferritin: 112.6 ng/mL (ref 10.0–291.0)

## 2021-03-26 LAB — IRON: Iron: 78 ug/dL (ref 42–145)

## 2021-04-04 ENCOUNTER — Other Ambulatory Visit: Payer: Self-pay | Admitting: Internal Medicine

## 2021-04-04 DIAGNOSIS — E519 Thiamine deficiency, unspecified: Secondary | ICD-10-CM | POA: Insufficient documentation

## 2021-04-04 DIAGNOSIS — D538 Other specified nutritional anemias: Secondary | ICD-10-CM

## 2021-04-04 LAB — VITAMIN B1: Vitamin B1 (Thiamine): 7 nmol/L — ABNORMAL LOW (ref 8–30)

## 2021-04-04 LAB — RETICULOCYTES
ABS Retic: 67620 cells/uL (ref 20000–80000)
Retic Ct Pct: 1.4 %

## 2021-04-04 MED ORDER — THIAMINE HCL 100 MG PO TABS
100.0000 mg | ORAL_TABLET | ORAL | 1 refills | Status: DC
Start: 2021-04-04 — End: 2021-11-05

## 2021-05-08 ENCOUNTER — Other Ambulatory Visit: Payer: Self-pay | Admitting: Internal Medicine

## 2021-05-08 DIAGNOSIS — I10 Essential (primary) hypertension: Secondary | ICD-10-CM

## 2021-05-20 ENCOUNTER — Other Ambulatory Visit: Payer: Self-pay | Admitting: Internal Medicine

## 2021-05-20 DIAGNOSIS — I10 Essential (primary) hypertension: Secondary | ICD-10-CM

## 2021-08-06 ENCOUNTER — Other Ambulatory Visit: Payer: Self-pay | Admitting: Internal Medicine

## 2021-08-06 DIAGNOSIS — I10 Essential (primary) hypertension: Secondary | ICD-10-CM

## 2021-08-16 ENCOUNTER — Other Ambulatory Visit: Payer: Self-pay | Admitting: Internal Medicine

## 2021-08-16 DIAGNOSIS — I10 Essential (primary) hypertension: Secondary | ICD-10-CM

## 2021-08-27 ENCOUNTER — Ambulatory Visit: Payer: BC Managed Care – PPO | Admitting: Internal Medicine

## 2021-10-11 ENCOUNTER — Other Ambulatory Visit: Payer: Self-pay | Admitting: Internal Medicine

## 2021-10-11 DIAGNOSIS — E785 Hyperlipidemia, unspecified: Secondary | ICD-10-CM

## 2021-11-05 ENCOUNTER — Telehealth: Payer: Self-pay | Admitting: Internal Medicine

## 2021-11-05 ENCOUNTER — Other Ambulatory Visit: Payer: Self-pay | Admitting: Internal Medicine

## 2021-11-05 DIAGNOSIS — E785 Hyperlipidemia, unspecified: Secondary | ICD-10-CM

## 2021-11-05 DIAGNOSIS — E519 Thiamine deficiency, unspecified: Secondary | ICD-10-CM

## 2021-11-05 DIAGNOSIS — I10 Essential (primary) hypertension: Secondary | ICD-10-CM

## 2021-11-05 MED ORDER — THIAMINE HCL 100 MG PO TABS: 100.0000 mg | ORAL_TABLET | ORAL | 1 refills | Status: DC

## 2021-11-05 MED ORDER — VERAPAMIL HCL ER 240 MG PO TBCR: 240.0000 mg | EXTENDED_RELEASE_TABLET | Freq: Every day | ORAL | 0 refills | Status: DC

## 2021-11-05 MED ORDER — INDAPAMIDE 1.25 MG PO TABS: 1.2500 mg | ORAL_TABLET | Freq: Every day | ORAL | 0 refills | Status: DC

## 2021-11-05 NOTE — Telephone Encounter (Signed)
Pt requesting a short supply of  ?Verapamil HCl 240 MG ? ?indapamide (LOZOL) 1.25 MG tablet ? ? ?Until next ov on 12-12-2021 ? ? ?

## 2021-12-12 ENCOUNTER — Encounter: Payer: Self-pay | Admitting: Internal Medicine

## 2021-12-12 ENCOUNTER — Ambulatory Visit (INDEPENDENT_AMBULATORY_CARE_PROVIDER_SITE_OTHER): Payer: Medicare PPO | Admitting: Internal Medicine

## 2021-12-12 VITALS — BP 158/98 | HR 79 | Temp 98.2°F | Resp 16 | Ht 63.0 in | Wt 196.0 lb

## 2021-12-12 DIAGNOSIS — N1831 Chronic kidney disease, stage 3a: Secondary | ICD-10-CM | POA: Diagnosis not present

## 2021-12-12 DIAGNOSIS — D72829 Elevated white blood cell count, unspecified: Secondary | ICD-10-CM

## 2021-12-12 DIAGNOSIS — I10 Essential (primary) hypertension: Secondary | ICD-10-CM

## 2021-12-12 DIAGNOSIS — D538 Other specified nutritional anemias: Secondary | ICD-10-CM | POA: Diagnosis not present

## 2021-12-12 DIAGNOSIS — M160 Bilateral primary osteoarthritis of hip: Secondary | ICD-10-CM

## 2021-12-12 DIAGNOSIS — Z1231 Encounter for screening mammogram for malignant neoplasm of breast: Secondary | ICD-10-CM

## 2021-12-12 DIAGNOSIS — E519 Thiamine deficiency, unspecified: Secondary | ICD-10-CM | POA: Diagnosis not present

## 2021-12-12 LAB — CBC WITH DIFFERENTIAL/PLATELET
Basophils Absolute: 0 10*3/uL (ref 0.0–0.1)
Basophils Relative: 0.3 % (ref 0.0–3.0)
Eosinophils Absolute: 0.1 10*3/uL (ref 0.0–0.7)
Eosinophils Relative: 0.5 % (ref 0.0–5.0)
HCT: 38.4 % (ref 36.0–46.0)
Hemoglobin: 12.3 g/dL (ref 12.0–15.0)
Lymphocytes Relative: 27.8 % (ref 12.0–46.0)
Lymphs Abs: 3.5 10*3/uL (ref 0.7–4.0)
MCHC: 32 g/dL (ref 30.0–36.0)
MCV: 82 fl (ref 78.0–100.0)
Monocytes Absolute: 0.8 10*3/uL (ref 0.1–1.0)
Monocytes Relative: 6 % (ref 3.0–12.0)
Neutro Abs: 8.2 10*3/uL — ABNORMAL HIGH (ref 1.4–7.7)
Neutrophils Relative %: 65.4 % (ref 43.0–77.0)
Platelets: 324 10*3/uL (ref 150.0–400.0)
RBC: 4.68 Mil/uL (ref 3.87–5.11)
RDW: 13.9 % (ref 11.5–15.5)
WBC: 12.6 10*3/uL — ABNORMAL HIGH (ref 4.0–10.5)

## 2021-12-12 LAB — BASIC METABOLIC PANEL
BUN: 13 mg/dL (ref 6–23)
CO2: 28 mEq/L (ref 19–32)
Calcium: 9.7 mg/dL (ref 8.4–10.5)
Chloride: 100 mEq/L (ref 96–112)
Creatinine, Ser: 1.13 mg/dL (ref 0.40–1.20)
GFR: 50.99 mL/min — ABNORMAL LOW (ref >60.00)
Glucose, Bld: 93 mg/dL (ref 70–99)
Potassium: 4.1 mEq/L (ref 3.5–5.1)
Sodium: 136 mEq/L (ref 135–145)

## 2021-12-12 MED ORDER — NEBIVOLOL HCL 5 MG PO TABS: 5.0000 mg | ORAL_TABLET | Freq: Every day | ORAL | 0 refills | Status: DC

## 2021-12-12 NOTE — Patient Instructions (Signed)
Hypertension, Adult High blood pressure (hypertension) is when the force of blood pumping through the arteries is too strong. The arteries are the blood vessels that carry blood from the heart throughout the body. Hypertension forces the heart to work harder to pump blood and may cause arteries to become narrow or stiff. Untreated or uncontrolled hypertension can lead to a heart attack, heart failure, a stroke, kidney disease, and other problems. A blood pressure reading consists of a higher number over a lower number. Ideally, your blood pressure should be below 120/80. The first ("top") number is called the systolic pressure. It is a measure of the pressure in your arteries as your heart beats. The second ("bottom") number is called the diastolic pressure. It is a measure of the pressure in your arteries as the heart relaxes. What are the causes? The exact cause of this condition is not known. There are some conditions that result in high blood pressure. What increases the risk? Certain factors may make you more likely to develop high blood pressure. Some of these risk factors are under your control, including: Smoking. Not getting enough exercise or physical activity. Being overweight. Having too much fat, sugar, calories, or salt (sodium) in your diet. Drinking too much alcohol. Other risk factors include: Having a personal history of heart disease, diabetes, high cholesterol, or kidney disease. Stress. Having a family history of high blood pressure and high cholesterol. Having obstructive sleep apnea. Age. The risk increases with age. What are the signs or symptoms? High blood pressure may not cause symptoms. Very high blood pressure (hypertensive crisis) may cause: Headache. Fast or irregular heartbeats (palpitations). Shortness of breath. Nosebleed. Nausea and vomiting. Vision changes. Severe chest pain, dizziness, and seizures. How is this diagnosed? This condition is diagnosed by  measuring your blood pressure while you are seated, with your arm resting on a flat surface, your legs uncrossed, and your feet flat on the floor. The cuff of the blood pressure monitor will be placed directly against the skin of your upper arm at the level of your heart. Blood pressure should be measured at least twice using the same arm. Certain conditions can cause a difference in blood pressure between your right and left arms. If you have a high blood pressure reading during one visit or you have normal blood pressure with other risk factors, you may be asked to: Return on a different day to have your blood pressure checked again. Monitor your blood pressure at home for 1 week or longer. If you are diagnosed with hypertension, you may have other blood or imaging tests to help your health care provider understand your overall risk for other conditions. How is this treated? This condition is treated by making healthy lifestyle changes, such as eating healthy foods, exercising more, and reducing your alcohol intake. You may be referred for counseling on a healthy diet and physical activity. Your health care provider may prescribe medicine if lifestyle changes are not enough to get your blood pressure under control and if: Your systolic blood pressure is above 130. Your diastolic blood pressure is above 80. Your personal target blood pressure may vary depending on your medical conditions, your age, and other factors. Follow these instructions at home: Eating and drinking  Eat a diet that is high in fiber and potassium, and low in sodium, added sugar, and fat. An example of this eating plan is called the DASH diet. DASH stands for Dietary Approaches to Stop Hypertension. To eat this way: Eat   plenty of fresh fruits and vegetables. Try to fill one half of your plate at each meal with fruits and vegetables. Eat whole grains, such as whole-wheat pasta, brown rice, or whole-grain bread. Fill about one  fourth of your plate with whole grains. Eat or drink low-fat dairy products, such as skim milk or low-fat yogurt. Avoid fatty cuts of meat, processed or cured meats, and poultry with skin. Fill about one fourth of your plate with lean proteins, such as fish, chicken without skin, beans, eggs, or tofu. Avoid pre-made and processed foods. These tend to be higher in sodium, added sugar, and fat. Reduce your daily sodium intake. Many people with hypertension should eat less than 1,500 mg of sodium a day. Do not drink alcohol if: Your health care provider tells you not to drink. You are pregnant, may be pregnant, or are planning to become pregnant. If you drink alcohol: Limit how much you have to: 0-1 drink a day for women. 0-2 drinks a day for men. Know how much alcohol is in your drink. In the [place] one drink equals one 12 oz bottle of beer (355 mL), one 5 oz glass of wine (148 mL), or one 1 oz glass of hard liquor (44 mL). Lifestyle  Work with your health care provider to maintain a healthy body weight or to lose weight. Ask what an ideal weight is for you. Get at least 30 minutes of exercise that causes your heart to beat faster (aerobic exercise) most days of the week. Activities may include walking, swimming, or biking. Include exercise to strengthen your muscles (resistance exercise), such as Pilates or lifting weights, as part of your weekly exercise routine. Try to do these types of exercises for 30 minutes at least 3 days a week. Do not use any products that contain nicotine or tobacco. These products include cigarettes, chewing tobacco, and vaping devices, such as e-cigarettes. If you need help quitting, ask your health care provider. Monitor your blood pressure at home as told by your health care provider. Keep all follow-up visits. This is important. Medicines Take over-the-counter and prescription medicines only as told by your health care provider. Follow directions carefully. Blood  pressure medicines must be taken as prescribed. Do not skip doses of blood pressure medicine. Doing this puts you at risk for problems and can make the medicine less effective. Ask your health care provider about side effects or reactions to medicines that you should watch for. Contact a health care provider if you: Think you are having a reaction to a medicine you are taking. Have headaches that keep coming back (recurring). Feel dizzy. Have swelling in your ankles. Have trouble with your vision. Get help right away if you: Develop a severe headache or confusion. Have unusual weakness or numbness. Feel faint. Have severe pain in your chest or abdomen. Vomit repeatedly. Have trouble breathing. These symptoms may be an emergency. Get help right away. Call 911. Do not wait to see if the symptoms will go away. Do not drive yourself to the hospital. Summary Hypertension is when the force of blood pumping through your arteries is too strong. If this condition is not controlled, it may put you at risk for serious complications. Your personal target blood pressure may vary depending on your medical conditions, your age, and other factors. For most people, a normal blood pressure is less than 120/80. Hypertension is treated with lifestyle changes, medicines, or a combination of both. Lifestyle changes include losing weight, eating a healthy,   low-sodium diet, exercising more, and limiting alcohol. This information is not intended to replace advice given to you by your health care provider. Make sure you discuss any questions you have with your health care provider. Document Revised: 06/18/2021 Document Reviewed: 06/18/2021 Elsevier Patient Education  2023 [employer]  

## 2021-12-12 NOTE — Progress Notes (Signed)
? ?Subjective:  ?Patient ID: Holly Fowler, female    DOB: [birth year]  Age: 66 y.o. MRN: [id number] ? ?CC: Hypertension and Anemia ? ? ?HPI ?Holly Fowler presents for f/up -  ? ?She complains of chronic hip pain and would like to see an orthopedic surgeon again.  She does not monitor her blood pressure.  She is active and denies chest pain, shortness of breath, headache, blurred vision, dizziness, lightheadedness, or edema.  She tells me she is compliant with verapamil and indapamide. ? ?Outpatient Medications Prior to Visit  ?Medication Sig Dispense Refill  ? atorvastatin (LIPITOR) 40 MG tablet Take 1 tablet by mouth once daily 90 tablet 0  ? indapamide (LOZOL) 1.25 MG tablet Take 1 tablet (1.25 mg total) by mouth daily. 90 tablet 0  ? levocetirizine (XYZAL) 5 MG tablet Take 1 tablet (5 mg total) by mouth every evening. 90 tablet 3  ? thiamine 100 MG tablet Take 1 tablet (100 mg total) by mouth every other day. 45 tablet 1  ? triamcinolone (NASACORT AQ) 55 MCG/ACT AERO nasal inhaler Place 2 sprays into the nose 2 (two) times daily. 3 Inhaler 3  ? triamcinolone cream (KENALOG) 0.5 % Apply 1 application topically 3 (three) times daily. 30 g 3  ? verapamil (CALAN-SR) 240 MG CR tablet Take 1 tablet (240 mg total) by mouth at bedtime. 90 tablet 0  ? ?No facility-administered medications prior to visit.  ? ? ?ROS ?Review of Systems  ?Constitutional:  Negative for chills, diaphoresis, fatigue and fever.  ?HENT: Negative.    ?Eyes: Negative.   ?Respiratory:  Negative for cough, chest tightness, shortness of breath and wheezing.   ?Cardiovascular:  Negative for chest pain, palpitations and leg swelling.  ?Gastrointestinal:  Negative for abdominal pain, constipation, diarrhea, nausea and vomiting.  ?Endocrine: Negative.   ?Genitourinary: Negative.  Negative for difficulty urinating.  ?Musculoskeletal:  Positive for arthralgias. Negative for back pain and myalgias.  ?Skin: Negative.   ?Neurological: Negative.  Negative for  dizziness, weakness and light-headedness.  ?Hematological:  Negative for adenopathy. Does not bruise/bleed easily.  ?Psychiatric/Behavioral: Negative.    ? ?Objective:  ?BP (!) 158/98 (BP Location: Right Arm, Patient Position: Sitting, Cuff Size: Large)   Pulse 79   Temp 98.2 ?F (36.8 ?C) (Oral)   Resp 16   Ht '5\' 3"'$  (1.6 m)   Wt 196 lb (88.9 kg)   SpO2 97%   BMI 34.72 kg/m?  ? ?BP Readings from Last 3 Encounters:  ?12/12/21 (!) 158/98  ?02/19/21 132/76  ?05/22/20 (!) 152/78  ? ? ?Wt Readings from Last 3 Encounters:  ?12/12/21 196 lb (88.9 kg)  ?02/19/21 202 lb (91.6 kg)  ?05/22/20 207 lb (93.9 kg)  ? ? ?Physical Exam ?Vitals reviewed.  ?HENT:  ?   Nose: Nose normal.  ?   Mouth/Throat:  ?   Mouth: Mucous membranes are moist.  ?Eyes:  ?   General: No scleral icterus. ?   Conjunctiva/sclera: Conjunctivae normal.  ?Cardiovascular:  ?   Rate and Rhythm: Normal rate and regular rhythm.  ?   Heart sounds: No murmur heard. ?Pulmonary:  ?   Effort: Pulmonary effort is normal.  ?   Breath sounds: No stridor. No wheezing, rhonchi or rales.  ?Abdominal:  ?   General: Abdomen is flat.  ?   Palpations: There is no mass.  ?   Tenderness: There is no abdominal tenderness. There is no guarding.  ?Musculoskeletal:  ?   Cervical back: Neck supple.  ?  Lymphadenopathy:  ?   Cervical: No cervical adenopathy.  ?Skin: ?   General: Skin is warm and dry.  ?   Findings: No lesion.  ?Neurological:  ?   General: No focal deficit present.  ?   Mental Status: She is alert.  ?Psychiatric:     ?   Mood and Affect: Mood normal.  ? ? ?Lab Results  ?Component Value Date  ? WBC 12.6 (H) 12/12/2021  ? HGB 12.3 12/12/2021  ? HCT 38.4 12/12/2021  ? PLT 324.0 12/12/2021  ? GLUCOSE 93 12/12/2021  ? CHOL 127 02/19/2021  ? TRIG 75.0 02/19/2021  ? HDL 39.20 02/19/2021  ? [place] 73 02/19/2021  ? ALT 9 02/19/2021  ? AST 12 02/19/2021  ? NA 136 12/12/2021  ? K 4.1 12/12/2021  ? CL 100 12/12/2021  ? CREATININE 1.13 12/12/2021  ? BUN 13 12/12/2021  ? CO2 28  12/12/2021  ? TSH 1.09 02/19/2021  ? HGBA1C 5.5 10/21/2018  ? ? ?No results found. ? ?Assessment & Plan:  ? ?Holly Fowler was seen today for hypertension and anemia. ? ?Diagnoses and all orders for this visit: ? ?Essential hypertension- Her blood pressure is not adequately well controlled.  Will add nebivolol to the current regimen. ?-     Basic metabolic panel; Future ?-     nebivolol (BYSTOLIC) 5 MG tablet; Take 1 tablet (5 mg total) by mouth daily. ?-     Basic metabolic panel ? ?Primary osteoarthritis of both hips ?-     Ambulatory referral to Orthopedic Surgery ? ?Stage 3a chronic kidney disease ([place])- She is avoiding nephrotoxic agents.  Will try to get better control of her blood pressure. ?-     Basic metabolic panel; Future ?-     CBC with Differential/Platelet; Future ?-     CBC with Differential/Platelet ?-     Basic metabolic panel ? ?Anemia due to acquired thiamine deficiency- Her H&H are normal now. ?-     CBC with Differential/Platelet; Future ?-     CBC with Differential/Platelet ? ?Leukocytosis, unspecified type- This is related to tobacco abuse and appears benign. ?-     CBC with Differential/Platelet; Future ?-     CBC with Differential/Platelet ? ?Visit for screening mammogram ?-     MM DIGITAL SCREENING BILATERAL; Future ? ? ?I am having Holly Fowler start on nebivolol. I am also having her maintain her levocetirizine, triamcinolone, triamcinolone cream, atorvastatin, indapamide, thiamine, and verapamil. ? ?Meds ordered this encounter  ?Medications  ? nebivolol (BYSTOLIC) 5 MG tablet  ?  Sig: Take 1 tablet (5 mg total) by mouth daily.  ?  Dispense:  90 tablet  ?  Refill:  0  ? ? ? ?Follow-up: Return in about 3 months (around 03/13/2022). ? ?[name], MD ?

## 2022-01-11 ENCOUNTER — Other Ambulatory Visit: Payer: Self-pay | Admitting: Internal Medicine

## 2022-01-11 DIAGNOSIS — E785 Hyperlipidemia, unspecified: Secondary | ICD-10-CM

## 2022-02-07 ENCOUNTER — Other Ambulatory Visit: Payer: Self-pay | Admitting: Internal Medicine

## 2022-02-07 DIAGNOSIS — I10 Essential (primary) hypertension: Secondary | ICD-10-CM

## 2022-03-14 ENCOUNTER — Ambulatory Visit
Admission: RE | Admit: 2022-03-14 | Discharge: 2022-03-14 | Disposition: A | Discharge status: Home / Self Care | Payer: Medicare PPO | Source: Ambulatory Visit | Attending: Internal Medicine | Admitting: Internal Medicine

## 2022-03-14 DIAGNOSIS — Z96641 Presence of right artificial hip joint: Secondary | ICD-10-CM | POA: Diagnosis not present

## 2022-03-14 DIAGNOSIS — M1612 Unilateral primary osteoarthritis, left hip: Secondary | ICD-10-CM | POA: Diagnosis not present

## 2022-03-14 DIAGNOSIS — Z1231 Encounter for screening mammogram for malignant neoplasm of breast: Secondary | ICD-10-CM

## 2022-03-14 DIAGNOSIS — M25552 Pain in left hip: Secondary | ICD-10-CM | POA: Diagnosis not present

## 2022-03-20 ENCOUNTER — Ambulatory Visit: Payer: Medicare PPO | Admitting: Internal Medicine

## 2022-03-20 ENCOUNTER — Encounter: Payer: Self-pay | Admitting: Internal Medicine

## 2022-03-20 VITALS — BP 142/86 | HR 81 | Temp 97.6°F | Resp 16 | Ht 63.0 in | Wt 194.0 lb

## 2022-03-20 DIAGNOSIS — D538 Other specified nutritional anemias: Secondary | ICD-10-CM | POA: Diagnosis not present

## 2022-03-20 DIAGNOSIS — N1831 Chronic kidney disease, stage 3a: Secondary | ICD-10-CM

## 2022-03-20 DIAGNOSIS — D72829 Elevated white blood cell count, unspecified: Secondary | ICD-10-CM | POA: Diagnosis not present

## 2022-03-20 DIAGNOSIS — E519 Thiamine deficiency, unspecified: Secondary | ICD-10-CM

## 2022-03-20 DIAGNOSIS — Z0000 Encounter for general adult medical examination without abnormal findings: Secondary | ICD-10-CM

## 2022-03-20 DIAGNOSIS — E785 Hyperlipidemia, unspecified: Secondary | ICD-10-CM

## 2022-03-20 DIAGNOSIS — I10 Essential (primary) hypertension: Secondary | ICD-10-CM | POA: Diagnosis not present

## 2022-03-20 DIAGNOSIS — Z720 Tobacco use: Secondary | ICD-10-CM

## 2022-03-20 DIAGNOSIS — R7989 Other specified abnormal findings of blood chemistry: Secondary | ICD-10-CM | POA: Insufficient documentation

## 2022-03-20 DIAGNOSIS — Z23 Encounter for immunization: Secondary | ICD-10-CM

## 2022-03-20 LAB — URINALYSIS, ROUTINE W REFLEX MICROSCOPIC
Bilirubin Urine: NEGATIVE
Hgb urine dipstick: NEGATIVE
Ketones, ur: NEGATIVE
Leukocytes,Ua: NEGATIVE
Nitrite: NEGATIVE
RBC / HPF: NONE SEEN (ref 0–?)
Specific Gravity, Urine: <=1.005 — AB (ref 1.000–1.030)
Total Protein, Urine: NEGATIVE
Urine Glucose: NEGATIVE
Urobilinogen, UA: 0.2 (ref 0.0–1.0)
WBC, UA: NONE SEEN (ref 0–?)
pH: 7 (ref 5.0–8.0)

## 2022-03-20 LAB — BASIC METABOLIC PANEL
BUN: 14 mg/dL (ref 6–23)
CO2: 27 mEq/L (ref 19–32)
Calcium: 9.6 mg/dL (ref 8.4–10.5)
Chloride: 101 mEq/L (ref 96–112)
Creatinine, Ser: 1.12 mg/dL (ref 0.40–1.20)
GFR: 51.44 mL/min — ABNORMAL LOW (ref >60.00)
Glucose, Bld: 88 mg/dL (ref 70–99)
Potassium: 4.1 mEq/L (ref 3.5–5.1)
Sodium: 137 mEq/L (ref 135–145)

## 2022-03-20 LAB — HEPATIC FUNCTION PANEL
ALT: 10 U/L (ref 0–35)
AST: 14 U/L (ref 0–37)
Albumin: 4.1 g/dL (ref 3.5–5.2)
Alkaline Phosphatase: 166 U/L — ABNORMAL HIGH (ref 39–117)
Bilirubin, Direct: 0.1 mg/dL (ref 0.0–0.3)
Total Bilirubin: 0.6 mg/dL (ref 0.2–1.2)
Total Protein: 7.5 g/dL (ref 6.0–8.3)

## 2022-03-20 LAB — CBC WITH DIFFERENTIAL/PLATELET
Basophils Absolute: 0.1 10*3/uL (ref 0.0–0.1)
Basophils Relative: 0.5 % (ref 0.0–3.0)
Eosinophils Absolute: 0.1 10*3/uL (ref 0.0–0.7)
Eosinophils Relative: 0.4 % (ref 0.0–5.0)
HCT: 39.2 % (ref 36.0–46.0)
Hemoglobin: 12.4 g/dL (ref 12.0–15.0)
Lymphocytes Relative: 16.8 % (ref 12.0–46.0)
Lymphs Abs: 2.5 10*3/uL (ref 0.7–4.0)
MCHC: 31.6 g/dL (ref 30.0–36.0)
MCV: 81.5 fl (ref 78.0–100.0)
Monocytes Absolute: 0.9 10*3/uL (ref 0.1–1.0)
Monocytes Relative: 6.3 % (ref 3.0–12.0)
Neutro Abs: 11.3 10*3/uL — ABNORMAL HIGH (ref 1.4–7.7)
Neutrophils Relative %: 76 % (ref 43.0–77.0)
Platelets: 343 10*3/uL (ref 150.0–400.0)
RBC: 4.8 Mil/uL (ref 3.87–5.11)
RDW: 14 % (ref 11.5–15.5)
WBC: 14.9 10*3/uL — ABNORMAL HIGH (ref 4.0–10.5)

## 2022-03-20 LAB — LIPID PANEL
Cholesterol: 137 mg/dL (ref 0–200)
HDL: 41.5 mg/dL (ref >39.00)
LDL Cholesterol: 78 mg/dL (ref 0–99)
NonHDL: 95.9
Total CHOL/HDL Ratio: 3
Triglycerides: 89 mg/dL (ref 0.0–149.0)
VLDL: 17.8 mg/dL (ref 0.0–40.0)

## 2022-03-20 LAB — TSH: TSH: 1.3 u[IU]/mL (ref 0.35–5.50)

## 2022-03-20 MED ORDER — NEBIVOLOL HCL 5 MG PO TABS
5.0000 mg | ORAL_TABLET | Freq: Every day | ORAL | 1 refills | Status: DC
Start: 2022-03-20 — End: 2022-09-11

## 2022-03-20 NOTE — Patient Instructions (Signed)

## 2022-03-20 NOTE — Progress Notes (Unsigned)
Subjective:  Patient ID: Holly Fowler, female    DOB: 10/30/1955  Age: 66 y.o. MRN: [id number]  CC: Annual Exam, Hypertension, and Hyperlipidemia   HPI Holly Fowler presents for a CPX and f/up -  She is not taking nebivolol.  Outpatient Medications Prior to Visit  Medication Sig Dispense Refill   atorvastatin (LIPITOR) 40 MG tablet Take 1 tablet by mouth once daily 90 tablet 1   indapamide (LOZOL) 1.25 MG tablet Take 1 tablet by mouth once daily 90 tablet 0   levocetirizine (XYZAL) 5 MG tablet Take 1 tablet (5 mg total) by mouth every evening. 90 tablet 3   thiamine 100 MG tablet Take 1 tablet (100 mg total) by mouth every other day. 45 tablet 1   triamcinolone (NASACORT AQ) 55 MCG/ACT AERO nasal inhaler Place 2 sprays into the nose 2 (two) times daily. 3 Inhaler 3   triamcinolone cream (KENALOG) 0.5 % Apply 1 application topically 3 (three) times daily. 30 g 3   verapamil (CALAN-SR) 240 MG CR tablet TAKE 1 TABLET BY MOUTH AT BEDTIME 90 tablet 0   nebivolol (BYSTOLIC) 5 MG tablet Take 1 tablet (5 mg total) by mouth daily. 90 tablet 0   No facility-administered medications prior to visit.    ROS Review of Systems  Objective:  BP (!) 142/86 (BP Location: Right Arm, Patient Position: Sitting, Cuff Size: Large)   Pulse 81   Temp 97.6 F (36.4 C) (Oral)   Resp 16   Ht '5\' 3"'$  (1.6 m)   Wt 194 lb (88 kg)   SpO2 97%   BMI 34.37 kg/m   BP Readings from Last 3 Encounters:  03/20/22 (!) 142/86  12/12/21 (!) 158/98  02/19/21 132/76    Wt Readings from Last 3 Encounters:  03/20/22 194 lb (88 kg)  12/12/21 196 lb (88.9 kg)  02/19/21 202 lb (91.6 kg)    Physical Exam  Lab Results  Component Value Date   WBC 14.9 (H) 03/20/2022   HGB 12.4 03/20/2022   HCT 39.2 03/20/2022   PLT 343.0 03/20/2022   GLUCOSE 88 03/20/2022   CHOL 137 03/20/2022   TRIG 89.0 03/20/2022   HDL 41.50 03/20/2022   LDLCALC 78 03/20/2022   ALT 10 03/20/2022   AST 14 03/20/2022   NA 137  03/20/2022   K 4.1 03/20/2022   CL 101 03/20/2022   CREATININE 1.12 03/20/2022   BUN 14 03/20/2022   CO2 27 03/20/2022   TSH 1.30 03/20/2022   HGBA1C 5.5 10/21/2018    MM 3D SCREEN BREAST BILATERAL  Result Date: 03/17/2022 CLINICAL DATA:  Screening. EXAM: DIGITAL SCREENING BILATERAL MAMMOGRAM WITH TOMOSYNTHESIS AND CAD TECHNIQUE: Bilateral screening digital craniocaudal and mediolateral oblique mammograms were obtained. Bilateral screening digital breast tomosynthesis was performed. The images were evaluated with computer-aided detection. COMPARISON:  Previous exam(s). ACR Breast Density Category b: There are scattered areas of fibroglandular density. FINDINGS: There are no findings suspicious for malignancy. IMPRESSION: No mammographic evidence of malignancy. A result letter of this screening mammogram will be mailed directly to the patient. RECOMMENDATION: Screening mammogram in one year. (Code:SM-B-01Y) BI-RADS CATEGORY  1: Negative. Electronically Signed   By: Fidela Salisbury M.D.   On: 03/17/2022 09:42    Assessment & Plan:   Holly Fowler was seen today for annual exam, hypertension and hyperlipidemia.  Diagnoses and all orders for this visit:  Stage 3a chronic kidney disease ([place]) -     Basic metabolic panel; Future -  Urinalysis, Routine w reflex microscopic; Future -     Urinalysis, Routine w reflex microscopic -     Basic metabolic panel  Essential hypertension -     Basic metabolic panel; Future -     TSH; Future -     Urinalysis, Routine w reflex microscopic; Future -     Hepatic function panel; Future -     nebivolol (BYSTOLIC) 5 MG tablet; Take 1 tablet (5 mg total) by mouth daily. -     Hepatic function panel -     Urinalysis, Routine w reflex microscopic -     TSH -     Basic metabolic panel  Routine general medical examination at a health care facility  Hyperlipidemia with target LDL less than 100 -     Lipid panel; Future -     TSH; Future -     Hepatic  function panel; Future -     Hepatic function panel -     TSH -     Lipid panel  Leukocytosis, unspecified type -     CBC with Differential/Platelet; Future -     CBC with Differential/Platelet  Anemia due to acquired thiamine deficiency   I am having Holly Fowler maintain her levocetirizine, triamcinolone, triamcinolone cream, thiamine, atorvastatin, verapamil, indapamide, and nebivolol.  Meds ordered this encounter  Medications   nebivolol (BYSTOLIC) 5 MG tablet    Sig: Take 1 tablet (5 mg total) by mouth daily.    Dispense:  90 tablet    Refill:  1     Follow-up: Return in about 6 months (around 09/20/2022).  Holly Calico, MD

## 2022-03-22 ENCOUNTER — Encounter: Payer: Self-pay | Admitting: Internal Medicine

## 2022-03-23 MED ORDER — SHINGRIX 50 MCG/0.5ML IM SUSR: 0.5000 mL | Freq: Once | INTRAMUSCULAR | 1 refills | Status: AC

## 2022-05-06 ENCOUNTER — Other Ambulatory Visit: Payer: Self-pay | Admitting: Internal Medicine

## 2022-05-06 DIAGNOSIS — I10 Essential (primary) hypertension: Secondary | ICD-10-CM

## 2022-05-20 ENCOUNTER — Other Ambulatory Visit: Payer: Self-pay | Admitting: Internal Medicine

## 2022-05-20 DIAGNOSIS — I10 Essential (primary) hypertension: Secondary | ICD-10-CM

## 2022-07-20 ENCOUNTER — Other Ambulatory Visit: Payer: Self-pay | Admitting: Internal Medicine

## 2022-07-20 DIAGNOSIS — E785 Hyperlipidemia, unspecified: Secondary | ICD-10-CM

## 2022-08-12 ENCOUNTER — Other Ambulatory Visit: Payer: Self-pay | Admitting: Internal Medicine

## 2022-08-12 DIAGNOSIS — I10 Essential (primary) hypertension: Secondary | ICD-10-CM

## 2022-08-23 ENCOUNTER — Other Ambulatory Visit: Payer: Self-pay | Admitting: Internal Medicine

## 2022-08-23 DIAGNOSIS — I10 Essential (primary) hypertension: Secondary | ICD-10-CM

## 2022-09-02 ENCOUNTER — Ambulatory Visit (INDEPENDENT_AMBULATORY_CARE_PROVIDER_SITE_OTHER): Payer: Medicare PPO

## 2022-09-02 VITALS — Ht 63.0 in | Wt 187.0 lb

## 2022-09-02 DIAGNOSIS — Z0000 Encounter for general adult medical examination without abnormal findings: Secondary | ICD-10-CM

## 2022-09-02 NOTE — Progress Notes (Signed)
Virtual Visit via Telephone Note  I connected with  Holly Fowler on 09/02/22 at 11:00 AM EST by telephone and verified that I am speaking with the correct person using two identifiers.  Location: Patient: Home Provider: [place] Persons participating in the virtual visit: [place]   I discussed the limitations, risks, security and privacy concerns of performing an evaluation and management service by telephone and the availability of in person appointments. The patient expressed understanding and agreed to proceed.  Interactive audio and video telecommunications were attempted between this nurse and patient, however failed, due to patient having technical difficulties OR patient did not have access to video capability.  We continued and completed visit with audio only.  Some vital signs may be absent or patient reported.   Sheral Flow, LPN  Subjective:   Holly Fowler is a 67 y.o. female who presents for an Initial Medicare Annual Wellness Visit.  Review of Systems     Cardiac Risk Factors include: advanced age (>42mn, >>55women);family history of premature cardiovascular disease;hypertension;dyslipidemia;obesity (BMI >30kg/m2)     Objective:    Today's Vitals   09/02/22 1102  Weight: 187 lb (84.8 kg)  Height: '5\' 3"'$  (1.6 m)  PainSc: 0-No pain   Body mass index is 33.13 kg/m.     09/02/2022   11:07 AM  Advanced Directives  Does Patient Have a Medical Advance Directive? No  Would patient like information on creating a medical advance directive? No - Patient declined    Current Medications (verified) Outpatient Encounter Medications as of 09/02/2022  Medication Sig   atorvastatin (LIPITOR) 40 MG tablet Take 1 tablet by mouth once daily   indapamide (LOZOL) 1.25 MG tablet Take 1 tablet by mouth once daily   levocetirizine (XYZAL) 5 MG tablet Take 1 tablet (5 mg total) by mouth every evening.   nebivolol (BYSTOLIC) 5 MG tablet Take 1  tablet (5 mg total) by mouth daily.   thiamine 100 MG tablet Take 1 tablet (100 mg total) by mouth every other day.   triamcinolone (NASACORT AQ) 55 MCG/ACT AERO nasal inhaler Place 2 sprays into the nose 2 (two) times daily.   triamcinolone cream (KENALOG) 0.5 % Apply 1 application topically 3 (three) times daily.   verapamil (CALAN-SR) 240 MG CR tablet TAKE 1 TABLET BY MOUTH AT BEDTIME   No facility-administered encounter medications on file as of 09/02/2022.    Allergies (verified) Patient has no known allergies.   History: Past Medical History:  Diagnosis Date   Hypertension    Past Surgical History:  Procedure Laterality Date   ADENOIDECTOMY  08/25/1969   TOTAL HIP ARTHROPLASTY Right    Family History  Problem Relation Age of Onset   Hypertension Mother    Hyperlipidemia Mother    Hypertension Father    Hyperlipidemia Father    Hypertension Sister    Hyperlipidemia Sister    Hypertension Brother    Hyperlipidemia Brother    Cancer Neg Hx    Diabetes Neg Hx    Stroke Neg Hx    Early death Neg Hx    Heart disease Neg Hx    Kidney disease Neg Hx    Social History   Socioeconomic History   Marital status: Married    Spouse name: Not on file   Number of children: Not on file   Years of education: 16   Highest education level: Not on file  Occupational History   Occupation: Retired  Employer: [employer]    Comment: Teacher  Tobacco Use   Smoking status: Every Day    Packs/day: 0.50    Years: 40.00    Total pack years: 20.00    Types: Cigarettes    Passive exposure: Current   Smokeless tobacco: Never  Substance and Sexual Activity   Alcohol use: No   Drug use: No   Sexual activity: Yes    Partners: Male  Other Topics Concern   Not on file  Social History Narrative   Holly Fowler is retired from [employer]. She lives with her husband and is caring for her father who has alzheimer's.   Social Determinants of Health   Financial Resource Strain:  Low Risk  (09/02/2022)   Overall Financial Resource Strain (CARDIA)    Difficulty of Paying Living Expenses: Not hard at all  Food Insecurity: No Food Insecurity (09/02/2022)   Hunger Vital Sign    Worried About Running Out of Food in the Last Year: Never true    Ran Out of Food in the Last Year: Never true  Transportation Needs: No Transportation Needs (09/02/2022)   PRAPARE - Hydrologist (Medical): No    Lack of Transportation (Non-Medical): No  Physical Activity: Inactive (09/02/2022)   Exercise Vital Sign    Days of Exercise per Week: 0 days    Minutes of Exercise per Session: 0 min  Stress: No Stress Concern Present (09/02/2022)   [place]    Feeling of Stress : Not at all  Social Connections: [name] (09/02/2022)   Social Connection and Isolation Panel [NHANES]    Frequency of Communication with Friends and Family: More than three times a week    Frequency of Social Gatherings with Friends and Family: More than three times a week    Attends Religious Services: More than 4 times per year    Active Member of [employer] or Organizations: Yes    Attends Music therapist: More than 4 times per year    Marital Status: Married    Tobacco Counseling Ready to quit: Not Answered Counseling given: Not Answered   Clinical Intake:  Pre-visit preparation completed: Yes  Pain : No/denies pain Pain Score: 0-No pain     BMI - recorded: 33.13 Nutritional Status: BMI > 30  Obese Nutritional Risks: None Diabetes: No  How often do you need to have someone help you when you read instructions, pamphlets, or other written materials from your doctor or pharmacy?: 1 - Never What is the last grade level you completed in school?: Bachelor's Degree; 1 year of post grad  Diabetic? no  Interpreter Needed?: No  Information entered by :: Lisette Abu, LPN.   Activities of Daily  Living    09/02/2022   11:09 AM  In your present state of health, do you have any difficulty performing the following activities:  Hearing? 0  Vision? 0  Difficulty concentrating or making decisions? 0  Walking or climbing stairs? 0  Dressing or bathing? 0  Doing errands, shopping? 0  Preparing Food and eating ? N  Using the Toilet? N  In the past six months, have you accidently leaked urine? N  Do you have problems with loss of bowel control? N  Managing your Medications? N  Managing your Finances? N  Housekeeping or managing your Housekeeping? N    Patient Care Team: Janith Lima, MD as PCP - General (Internal  Medicine)  Indicate any recent Medical Services you may have received from other than [facility] providers in the past year (date may be approximate).     Assessment:   This is a routine wellness examination for [place].  Hearing/Vision screen Hearing Screening - Comments:: Denies hearing difficulties   Vision Screening - Comments:: Wears rx glasses - up to date with routine eye exams with [facility].   Dietary issues and exercise activities discussed: Current Exercise Habits: The patient does not participate in regular exercise at present, Exercise limited by: orthopedic condition(s)   Goals Addressed             This Visit's Progress    My goal for 2024 is to maintain my health.        Depression Screen    09/02/2022   11:11 AM 03/20/2022   10:34 AM 02/19/2021   10:53 AM 11/01/2019    8:48 AM 10/21/2018   10:08 AM 10/19/2017    8:56 AM 01/11/2013   10:03 AM  PHQ 2/9 Scores  PHQ - 2 Score 0 0 0 0 0 0 0  PHQ- 9 Score      0     Fall Risk    09/02/2022   11:08 AM 12/12/2021    1:28 PM 02/19/2021   10:53 AM 10/21/2018   10:08 AM 10/19/2017    8:56 AM  Fall Risk   Falls in the past year? 0 0  0 No  Number falls in past yr: 0  0 0   Injury with Fall? 0  0 0   Risk for fall due to : No Fall Risks      Follow up Falls prevention discussed   Falls evaluation  completed     FALL RISK PREVENTION PERTAINING TO THE HOME:  Any stairs in or around the home? Yes  If so, are there any without handrails? No  Home free of loose throw rugs in walkways, pet beds, electrical cords, etc? Yes  Adequate lighting in your home to reduce risk of falls? Yes   ASSISTIVE DEVICES UTILIZED TO PREVENT FALLS:  Life alert? No  Use of a cane, walker or w/c? No  Grab bars in the bathroom? Yes  Shower chair or bench in shower? No  Elevated toilet seat or a handicapped toilet? Yes   TIMED UP AND GO:  Was the test performed? No . Phone Visit   Cognitive Function:        09/02/2022   11:10 AM  6CIT Screen  What Year? 0 points  What month? 0 points  What time? 0 points  Count back from 20 0 points  Months in reverse 0 points  Repeat phrase 0 points  Total Score 0 points    Immunizations Immunization History  Administered Date(s) Administered   Influenza,inj,Quad PF,6+ Mos 07/15/2013, 08/29/2014, 08/21/2015, 10/20/2016, 08/13/2017, 04/20/2018, 05/22/2020   Influenza-Unspecified 08/26/2011   Moderna Sars-Covid-2 Vaccination 01/11/2020, 02/08/2020   Pneumococcal Conjugate-13 03/13/2016   Pneumococcal Polysaccharide-23 02/17/2014, 02/19/2021   Tdap 01/11/2013    TDAP status: Up to date  Flu Vaccine status: Due, Education has been provided regarding the importance of this vaccine. Advised may receive this vaccine at local pharmacy or Health Dept. Aware to provide a copy of the vaccination record if obtained from local pharmacy or Health Dept. Verbalized acceptance and understanding.  Pneumococcal vaccine status: Up to date  Covid-19 vaccine status: Completed vaccines  Qualifies for Shingles Vaccine? Yes   Zostavax completed No  Shingrix Completed?: No.    Education has been provided regarding the importance of this vaccine. Patient has been advised to call insurance company to determine out of pocket expense if they have not yet received this vaccine.  Advised may also receive vaccine at local pharmacy or Health Dept. Verbalized acceptance and understanding.  Screening Tests Health Maintenance  Topic Date Due   Lung Cancer Screening  Never done   Zoster Vaccines- Shingrix (1 of 2) Never done   DEXA SCAN  Never done   INFLUENZA VACCINE  03/25/2022   COVID-19 Vaccine (3 - 2023-24 season) 04/25/2022   DTaP/Tdap/Td (2 - Td or Tdap) 01/12/2023   Medicare Annual Wellness (AWV)  09/03/2023   MAMMOGRAM  03/14/2024   COLONOSCOPY (Pts 45-43yr Insurance coverage will need to be confirmed)  05/18/2024   Pneumonia Vaccine 67 Years old (3 - PPSV23 or PCV20) 02/19/2026   Hepatitis C Screening  Completed   HPV VACCINES  Aged Out    Health Maintenance  Health Maintenance Due  Topic Date Due   Lung Cancer Screening  Never done   Zoster Vaccines- Shingrix (1 of 2) Never done   DEXA SCAN  Never done   INFLUENZA VACCINE  03/25/2022   COVID-19 Vaccine (3 - 2023-24 season) 04/25/2022    Colorectal cancer screening: Type of screening: Colonoscopy. Completed 05/18/2014. Repeat every 10 years  Mammogram status: Completed 03/14/2022. Repeat every year  Bone Density status: Never done  Lung Cancer Screening: (Low Dose CT Chest recommended if Age 67-80years, 30 pack-year currently smoking OR have quit w/in 15years.) does qualify.   Lung Cancer Screening Referral: referral placed 03/23/22; patient declined.  Additional Screening:  Hepatitis C Screening: does qualify; Completed 03/13/2016  Vision Screening: Recommended annual ophthalmology exams for early detection of glaucoma and other disorders of the eye. Is the patient up to date with their annual eye exam?  Yes  Who is the provider or what is the name of the office in which the patient attends annual eye exams? [facility] [place] If pt is not established with a provider, would they like to be referred to a provider to establish care? No .   Dental Screening: Recommended annual dental exams for  proper oral hygiene  Community Resource Referral / Chronic Care Management: CRR required this visit?  No   CCM required this visit?  No      Plan:     I have personally reviewed and noted the following in the patient's chart:   Medical and social history Use of alcohol, tobacco or illicit drugs  Current medications and supplements including opioid prescriptions. Patient is not currently taking opioid prescriptions. Functional ability and status Nutritional status Physical activity Advanced directives List of other physicians Hospitalizations, surgeries, and ER visits in previous 12 months Vitals Screenings to include cognitive, depression, and falls Referrals and appointments  In addition, I have reviewed and discussed with patient certain preventive protocols, quality metrics, and best practice recommendations. A written personalized care plan for preventive services as well as general preventive health recommendations were provided to patient.     SSheral Flow LPN   [id number]  Nurse Notes: N/A

## 2022-09-02 NOTE — Patient Instructions (Addendum)
[name] , Thank you for taking time to come for your Medicare Wellness Visit. I appreciate your ongoing commitment to your health goals. Please review the following plan we discussed and let me know if I can assist you in the future.   These are the goals we discussed:  Goals      My goal for 2024 is to maintain my health.        This is a list of the screening recommended for you and due dates:  Health Maintenance  Topic Date Due   Screening for Lung Cancer  Never done   Zoster (Shingles) Vaccine (1 of 2) Never done   DEXA scan (bone density measurement)  Never done   Flu Shot  03/25/2022   COVID-19 Vaccine (3 - 2023-24 season) 04/25/2022   DTaP/Tdap/Td vaccine (2 - Td or Tdap) 01/12/2023   Medicare Annual Wellness Visit  09/03/2023   Mammogram  03/14/2024   Colon Cancer Screening  05/18/2024   Pneumonia Vaccine (3 - PPSV23 or PCV20) 02/19/2026   Hepatitis C Screening: USPSTF Recommendation to screen - Ages 43-79 yo.  Completed   HPV Vaccine  Aged Out    Advanced directives: No  Conditions/risks identified: Yes  Next appointment: Follow up in one year for your annual wellness visit.   Preventive Care 16 Years and Older, Female Preventive care refers to lifestyle choices and visits with your health care provider that can promote health and wellness. What does preventive care include? A yearly physical exam. This is also called an annual well check. Dental exams once or twice a year. Routine eye exams. Ask your health care provider how often you should have your eyes checked. Personal lifestyle choices, including: Daily care of your teeth and gums. Regular physical activity. Eating a healthy diet. Avoiding tobacco and drug use. Limiting alcohol use. Practicing safe sex. Taking low-dose aspirin every day. Taking vitamin and mineral supplements as recommended by your health care provider. What happens during an annual well check? The services and screenings done by  your health care provider during your annual well check will depend on your age, overall health, lifestyle risk factors, and family history of disease. Counseling  Your health care provider may ask you questions about your: Alcohol use. Tobacco use. Drug use. Emotional well-being. Home and relationship well-being. Sexual activity. Eating habits. History of falls. Memory and ability to understand (cognition). Work and work Statistician. Reproductive health. Screening  You may have the following tests or measurements: Height, weight, and BMI. Blood pressure. Lipid and cholesterol levels. These may be checked every 5 years, or more frequently if you are over 77 years old. Skin check. Lung cancer screening. You may have this screening every year starting at age 105 if you have a 30-pack-year history of smoking and currently smoke or have quit within the past 15 years. Fecal occult blood test (FOBT) of the stool. You may have this test every year starting at age 41. Flexible sigmoidoscopy or colonoscopy. You may have a sigmoidoscopy every 5 years or a colonoscopy every 10 years starting at age 59. Hepatitis C blood test. Hepatitis B blood test. Sexually transmitted disease (STD) testing. Diabetes screening. This is done by checking your blood sugar (glucose) after you have not eaten for a while (fasting). You may have this done every 1-3 years. Bone density scan. This is done to screen for osteoporosis. You may have this done starting at age 17. Mammogram. This may be done every 1-2 years. Talk  to your health care provider about how often you should have regular mammograms. Talk with your health care provider about your test results, treatment options, and if necessary, the need for more tests. Vaccines  Your health care provider may recommend certain vaccines, such as: Influenza vaccine. This is recommended every year. Tetanus, diphtheria, and acellular pertussis (Tdap, Td) vaccine. You  may need a Td booster every 10 years. Zoster vaccine. You may need this after age 35. Pneumococcal 13-valent conjugate (PCV13) vaccine. One dose is recommended after age 18. Pneumococcal polysaccharide (PPSV23) vaccine. One dose is recommended after age 71. Talk to your health care provider about which screenings and vaccines you need and how often you need them. This information is not intended to replace advice given to you by your health care provider. Make sure you discuss any questions you have with your health care provider. Document Released: 09/07/2015 Document Revised: 04/30/2016 Document Reviewed: 06/12/2015 Elsevier Interactive Patient Education  2017 [employer] Prevention in the Home Falls can cause injuries. They can happen to people of all ages. There are many things you can do to make your home safe and to help prevent falls. What can I do on the outside of my home? Regularly fix the edges of walkways and driveways and fix any cracks. Remove anything that might make you trip as you walk through a door, such as a raised step or threshold. Trim any bushes or trees on the path to your home. Use bright outdoor lighting. Clear any walking paths of anything that might make someone trip, such as rocks or tools. Regularly check to see if handrails are loose or broken. Make sure that both sides of any steps have handrails. Any raised decks and porches should have guardrails on the edges. Have any leaves, snow, or ice cleared regularly. Use sand or salt on walking paths during winter. Clean up any spills in your garage right away. This includes oil or grease spills. What can I do in the bathroom? Use night lights. Install grab bars by the toilet and in the tub and shower. Do not use towel bars as grab bars. Use non-skid mats or decals in the tub or shower. If you need to sit down in the shower, use a plastic, non-slip stool. Keep the floor dry. Clean up any water that spills  on the floor as soon as it happens. Remove soap buildup in the tub or shower regularly. Attach bath mats securely with double-sided non-slip rug tape. Do not have throw rugs and other things on the floor that can make you trip. What can I do in the bedroom? Use night lights. Make sure that you have a light by your bed that is easy to reach. Do not use any sheets or blankets that are too big for your bed. They should not hang down onto the floor. Have a firm chair that has side arms. You can use this for support while you get dressed. Do not have throw rugs and other things on the floor that can make you trip. What can I do in the kitchen? Clean up any spills right away. Avoid walking on wet floors. Keep items that you use a lot in easy-to-reach places. If you need to reach something above you, use a strong step stool that has a grab bar. Keep electrical cords out of the way. Do not use floor polish or wax that makes floors slippery. If you must use wax, use non-skid floor wax. Do  not have throw rugs and other things on the floor that can make you trip. What can I do with my stairs? Do not leave any items on the stairs. Make sure that there are handrails on both sides of the stairs and use them. Fix handrails that are broken or loose. Make sure that handrails are as long as the stairways. Check any carpeting to make sure that it is firmly attached to the stairs. Fix any carpet that is loose or worn. Avoid having throw rugs at the top or bottom of the stairs. If you do have throw rugs, attach them to the floor with carpet tape. Make sure that you have a light switch at the top of the stairs and the bottom of the stairs. If you do not have them, ask someone to add them for you. What else can I do to help prevent falls? Wear shoes that: Do not have high heels. Have rubber bottoms. Are comfortable and fit you well. Are closed at the toe. Do not wear sandals. If you use a stepladder: Make  sure that it is fully opened. Do not climb a closed stepladder. Make sure that both sides of the stepladder are locked into place. Ask someone to hold it for you, if possible. Clearly mark and make sure that you can see: Any grab bars or handrails. First and last steps. Where the edge of each step is. Use tools that help you move around (mobility aids) if they are needed. These include: Canes. Walkers. Scooters. Crutches. Turn on the lights when you go into a dark area. Replace any light bulbs as soon as they burn out. Set up your furniture so you have a clear path. Avoid moving your furniture around. If any of your floors are uneven, fix them. If there are any pets around you, be aware of where they are. Review your medicines with your doctor. Some medicines can make you feel dizzy. This can increase your chance of falling. Ask your doctor what other things that you can do to help prevent falls. This information is not intended to replace advice given to you by your health care provider. Make sure you discuss any questions you have with your health care provider. Document Released: 06/07/2009 Document Revised: 01/17/2016 Document Reviewed: 09/15/2014 Elsevier Interactive Patient Education  2017 [employer].

## 2022-09-11 ENCOUNTER — Ambulatory Visit: Payer: Medicare PPO | Admitting: Internal Medicine

## 2022-09-11 ENCOUNTER — Encounter: Payer: Self-pay | Admitting: Internal Medicine

## 2022-09-11 VITALS — BP 134/86 | HR 80 | Temp 98.8°F | Ht 63.0 in | Wt 197.0 lb

## 2022-09-11 DIAGNOSIS — L2084 Intrinsic (allergic) eczema: Secondary | ICD-10-CM | POA: Diagnosis not present

## 2022-09-11 DIAGNOSIS — J301 Allergic rhinitis due to pollen: Secondary | ICD-10-CM

## 2022-09-11 DIAGNOSIS — N1831 Chronic kidney disease, stage 3a: Secondary | ICD-10-CM | POA: Diagnosis not present

## 2022-09-11 DIAGNOSIS — Z23 Encounter for immunization: Secondary | ICD-10-CM | POA: Diagnosis not present

## 2022-09-11 DIAGNOSIS — D538 Other specified nutritional anemias: Secondary | ICD-10-CM

## 2022-09-11 DIAGNOSIS — D72829 Elevated white blood cell count, unspecified: Secondary | ICD-10-CM | POA: Diagnosis not present

## 2022-09-11 DIAGNOSIS — E785 Hyperlipidemia, unspecified: Secondary | ICD-10-CM | POA: Diagnosis not present

## 2022-09-11 DIAGNOSIS — Z720 Tobacco use: Secondary | ICD-10-CM

## 2022-09-11 DIAGNOSIS — E519 Thiamine deficiency, unspecified: Secondary | ICD-10-CM

## 2022-09-11 DIAGNOSIS — I10 Essential (primary) hypertension: Secondary | ICD-10-CM

## 2022-09-11 LAB — CBC WITH DIFFERENTIAL/PLATELET
Basophils Absolute: 0.1 10*3/uL (ref 0.0–0.1)
Basophils Relative: 0.4 % (ref 0.0–3.0)
Eosinophils Absolute: 0 10*3/uL (ref 0.0–0.7)
Eosinophils Relative: 0.3 % (ref 0.0–5.0)
HCT: 36.5 % (ref 36.0–46.0)
Hemoglobin: 11.9 g/dL — ABNORMAL LOW (ref 12.0–15.0)
Lymphocytes Relative: 21.5 % (ref 12.0–46.0)
Lymphs Abs: 3 10*3/uL (ref 0.7–4.0)
MCHC: 32.6 g/dL (ref 30.0–36.0)
MCV: 81.7 fl (ref 78.0–100.0)
Monocytes Absolute: 0.9 10*3/uL (ref 0.1–1.0)
Monocytes Relative: 6.7 % (ref 3.0–12.0)
Neutro Abs: 9.9 10*3/uL — ABNORMAL HIGH (ref 1.4–7.7)
Neutrophils Relative %: 71.1 % (ref 43.0–77.0)
Platelets: 347 10*3/uL (ref 150.0–400.0)
RBC: 4.47 Mil/uL (ref 3.87–5.11)
RDW: 13.8 % (ref 11.5–15.5)
WBC: 13.9 10*3/uL — ABNORMAL HIGH (ref 4.0–10.5)

## 2022-09-11 LAB — BASIC METABOLIC PANEL
BUN: 14 mg/dL (ref 6–23)
CO2: 29 mEq/L (ref 19–32)
Calcium: 9.4 mg/dL (ref 8.4–10.5)
Chloride: 100 mEq/L (ref 96–112)
Creatinine, Ser: 1.08 mg/dL (ref 0.40–1.20)
GFR: 53.55 mL/min — ABNORMAL LOW (ref >60.00)
Glucose, Bld: 91 mg/dL (ref 70–99)
Potassium: 4.1 mEq/L (ref 3.5–5.1)
Sodium: 136 mEq/L (ref 135–145)

## 2022-09-11 MED ORDER — INDAPAMIDE 1.25 MG PO TABS: 1.2500 mg | ORAL_TABLET | Freq: Every day | ORAL | 1 refills | Status: DC

## 2022-09-11 MED ORDER — LEVOCETIRIZINE DIHYDROCHLORIDE 5 MG PO TABS: 5.0000 mg | ORAL_TABLET | Freq: Every evening | ORAL | 3 refills | Status: DC

## 2022-09-11 MED ORDER — TRIAMCINOLONE ACETONIDE 55 MCG/ACT NA AERO: 2.0000 | INHALATION_SPRAY | Freq: Two times a day (BID) | NASAL | 3 refills | Status: DC

## 2022-09-11 MED ORDER — NEBIVOLOL HCL 5 MG PO TABS: 5.0000 mg | ORAL_TABLET | Freq: Every day | ORAL | 1 refills | Status: DC

## 2022-09-11 MED ORDER — THIAMINE HCL 100 MG PO TABS: 100.0000 mg | ORAL_TABLET | ORAL | 1 refills | Status: DC

## 2022-09-11 MED ORDER — TRIAMCINOLONE ACETONIDE 0.5 % EX CREA: 1.0000 | TOPICAL_CREAM | Freq: Three times a day (TID) | CUTANEOUS | 3 refills | Status: DC

## 2022-09-11 MED ORDER — VERAPAMIL HCL ER 240 MG PO TBCR: 240.0000 mg | EXTENDED_RELEASE_TABLET | Freq: Every day | ORAL | 1 refills | Status: DC

## 2022-09-11 MED ORDER — ATORVASTATIN CALCIUM 40 MG PO TABS: 40.0000 mg | ORAL_TABLET | Freq: Every day | ORAL | 1 refills | Status: DC

## 2022-09-11 NOTE — Progress Notes (Signed)
Subjective:  Patient ID: Holly Fowler, female    DOB: February 24, 1956  Age: 67 y.o. MRN: [id number]  CC: Hypertension, Hyperlipidemia, and Allergic Rhinitis    HPI Holly Fowler presents for f/up -   She is active and denies chest pain, shortness of breath, diaphoresis, or edema.  Outpatient Medications Prior to Visit  Medication Sig Dispense Refill   atorvastatin (LIPITOR) 40 MG tablet Take 1 tablet by mouth once daily 90 tablet 0   indapamide (LOZOL) 1.25 MG tablet Take 1 tablet by mouth once daily 90 tablet 0   levocetirizine (XYZAL) 5 MG tablet Take 1 tablet (5 mg total) by mouth every evening. 90 tablet 3   nebivolol (BYSTOLIC) 5 MG tablet Take 1 tablet (5 mg total) by mouth daily. 90 tablet 1   thiamine 100 MG tablet Take 1 tablet (100 mg total) by mouth every other day. 45 tablet 1   triamcinolone (NASACORT AQ) 55 MCG/ACT AERO nasal inhaler Place 2 sprays into the nose 2 (two) times daily. 3 Inhaler 3   triamcinolone cream (KENALOG) 0.5 % Apply 1 application topically 3 (three) times daily. 30 g 3   verapamil (CALAN-SR) 240 MG CR tablet TAKE 1 TABLET BY MOUTH AT BEDTIME 90 tablet 0   No facility-administered medications prior to visit.    ROS Review of Systems  Constitutional: Negative.  Negative for diaphoresis, fatigue and unexpected weight change.  HENT:  Positive for congestion, postnasal drip and rhinorrhea. Negative for nosebleeds, sinus pressure, sinus pain, sore throat and voice change.   Respiratory:  Negative for cough, chest tightness, shortness of breath and wheezing.   Cardiovascular:  Negative for chest pain, palpitations and leg swelling.  Gastrointestinal:  Negative for abdominal pain, diarrhea, nausea and vomiting.  Genitourinary: Negative.   Musculoskeletal: Negative.   Skin: Negative.   Neurological: Negative.  Negative for dizziness, weakness and headaches.  Hematological:  Negative for adenopathy. Does not bruise/bleed easily.  Psychiatric/Behavioral:  Negative.      Objective:  BP 134/86 (BP Location: Left Arm, Patient Position: Sitting, Cuff Size: Large)   Pulse 80   Temp 98.8 F (37.1 C) (Oral)   Ht 5\' 3"  (1.6 m)   Wt 197 lb (89.4 kg)   SpO2 97%   BMI 34.90 kg/m   BP Readings from Last 3 Encounters:  09/11/22 134/86  03/20/22 (!) 142/86  12/12/21 (!) 158/98    Wt Readings from Last 3 Encounters:  09/11/22 197 lb (89.4 kg)  09/02/22 187 lb (84.8 kg)  03/20/22 194 lb (88 kg)    Physical Exam Vitals reviewed.  HENT:     Mouth/Throat:     Mouth: Mucous membranes are moist.  Eyes:     General: No scleral icterus.    Conjunctiva/sclera: Conjunctivae normal.  Cardiovascular:     Rate and Rhythm: Normal rate and regular rhythm.     Heart sounds: No murmur heard. Pulmonary:     Effort: Pulmonary effort is normal.     Breath sounds: No stridor. No wheezing, rhonchi or rales.  Abdominal:     General: Abdomen is flat.     Palpations: There is no mass.     Tenderness: There is no abdominal tenderness. There is no guarding.     Hernia: No hernia is present.  Musculoskeletal:        General: Normal range of motion.     Cervical back: Neck supple.     Right lower leg: No edema.  Left lower leg: No edema.  Lymphadenopathy:     Cervical: No cervical adenopathy.  Skin:    General: Skin is warm and dry.  Neurological:     General: No focal deficit present.     Mental Status: She is alert.  Psychiatric:        Mood and Affect: Mood normal.        Behavior: Behavior normal.     Lab Results  Component Value Date   WBC 13.9 (H) 09/11/2022   HGB 11.9 (L) 09/11/2022   HCT 36.5 09/11/2022   PLT 347.0 09/11/2022   GLUCOSE 91 09/11/2022   CHOL 137 03/20/2022   TRIG 89.0 03/20/2022   HDL 41.50 03/20/2022   LDLCALC 78 03/20/2022   ALT 10 03/20/2022   AST 14 03/20/2022   NA 136 09/11/2022   K 4.1 09/11/2022   CL 100 09/11/2022   CREATININE 1.08 09/11/2022   BUN 14 09/11/2022   CO2 29 09/11/2022   TSH 1.30  03/20/2022   HGBA1C 5.5 10/21/2018    MM 3D SCREEN BREAST BILATERAL  Result Date: 03/17/2022 CLINICAL DATA:  Screening. EXAM: DIGITAL SCREENING BILATERAL MAMMOGRAM WITH TOMOSYNTHESIS AND CAD TECHNIQUE: Bilateral screening digital craniocaudal and mediolateral oblique mammograms were obtained. Bilateral screening digital breast tomosynthesis was performed. The images were evaluated with computer-aided detection. COMPARISON:  Previous exam(s). ACR Breast Density Category b: There are scattered areas of fibroglandular density. FINDINGS: There are no findings suspicious for malignancy. IMPRESSION: No mammographic evidence of malignancy. A result letter of this screening mammogram will be mailed directly to the patient. RECOMMENDATION: Screening mammogram in one year. (Code:SM-B-01Y) BI-RADS CATEGORY  1: Negative. Electronically Signed   By: Ted Mcalpine M.D.   On: 03/17/2022 09:42    Assessment & Plan:   Holly Fowler was seen today for hypertension, hyperlipidemia and allergic rhinitis .  Diagnoses and all orders for this visit:  Stage 3a chronic kidney disease (HCC)-will avoid nephrotoxic agents. -     CBC with Differential/Platelet; Future -     Basic metabolic panel; Future -     Urinalysis, Routine w reflex microscopic; Future -     Urinalysis, Routine w reflex microscopic -     Basic metabolic panel -     CBC with Differential/Platelet  Leukocytosis, unspecified type-this is stable and caused by tobacco abuse. -     CBC with Differential/Platelet; Future -     CBC with Differential/Platelet  Hyperlipidemia with target LDL less than 100 -     atorvastatin (LIPITOR) 40 MG tablet; Take 1 tablet (40 mg total) by mouth daily.  Essential hypertension-her blood pressure is adequately well-controlled. -     indapamide (LOZOL) 1.25 MG tablet; Take 1 tablet (1.25 mg total) by mouth daily. -     nebivolol (BYSTOLIC) 5 MG tablet; Take 1 tablet (5 mg total) by mouth daily. -     verapamil  (CALAN-SR) 240 MG CR tablet; Take 1 tablet (240 mg total) by mouth at bedtime.  Acute nonseasonal allergic rhinitis due to pollen -     levocetirizine (XYZAL) 5 MG tablet; Take 1 tablet (5 mg total) by mouth every evening. -     triamcinolone (NASACORT AQ) 55 MCG/ACT AERO nasal inhaler; Place 2 sprays into the nose 2 (two) times daily.  Anemia due to acquired thiamine deficiency -     thiamine (VITAMIN B1) 100 MG tablet; Take 1 tablet (100 mg total) by mouth every other day.  Intrinsic eczema -  triamcinolone cream (KENALOG) 0.5 %; Apply 1 Application topically 3 (three) times daily.  Flu vaccine need -     Flu Vaccine QUAD High Dose(Fluad)  Tobacco abuse disorder -     Ambulatory Referral for Lung Cancer Scre   I have changed Holly Fowler's atorvastatin, indapamide, thiamine, and verapamil. I am also having her maintain her nebivolol, levocetirizine, triamcinolone, and triamcinolone cream.  Meds ordered this encounter  Medications   atorvastatin (LIPITOR) 40 MG tablet    Sig: Take 1 tablet (40 mg total) by mouth daily.    Dispense:  90 tablet    Refill:  1   indapamide (LOZOL) 1.25 MG tablet    Sig: Take 1 tablet (1.25 mg total) by mouth daily.    Dispense:  90 tablet    Refill:  1   nebivolol (BYSTOLIC) 5 MG tablet    Sig: Take 1 tablet (5 mg total) by mouth daily.    Dispense:  90 tablet    Refill:  1   levocetirizine (XYZAL) 5 MG tablet    Sig: Take 1 tablet (5 mg total) by mouth every evening.    Dispense:  90 tablet    Refill:  3   thiamine (VITAMIN B1) 100 MG tablet    Sig: Take 1 tablet (100 mg total) by mouth every other day.    Dispense:  45 tablet    Refill:  1   triamcinolone (NASACORT AQ) 55 MCG/ACT AERO nasal inhaler    Sig: Place 2 sprays into the nose 2 (two) times daily.    Dispense:  3 each    Refill:  3   triamcinolone cream (KENALOG) 0.5 %    Sig: Apply 1 Application topically 3 (three) times daily.    Dispense:  30 g    Refill:  3    verapamil (CALAN-SR) 240 MG CR tablet    Sig: Take 1 tablet (240 mg total) by mouth at bedtime.    Dispense:  90 tablet    Refill:  1     Follow-up: Return in about 6 months (around 03/12/2023).  Sanda Linger, MD

## 2022-09-11 NOTE — Patient Instructions (Signed)
Hypertension, Adult High blood pressure (hypertension) is when the force of blood pumping through the arteries is too strong. The arteries are the blood vessels that carry blood from the heart throughout the body. Hypertension forces the heart to work harder to pump blood and may cause arteries to become narrow or stiff. Untreated or uncontrolled hypertension can lead to a heart attack, heart failure, a stroke, kidney disease, and other problems. A blood pressure reading consists of a higher number over a lower number. Ideally, your blood pressure should be below 120/80. The first ("top") number is called the systolic pressure. It is a measure of the pressure in your arteries as your heart beats. The second ("bottom") number is called the diastolic pressure. It is a measure of the pressure in your arteries as the heart relaxes. What are the causes? The exact cause of this condition is not known. There are some conditions that result in high blood pressure. What increases the risk? Certain factors may make you more likely to develop high blood pressure. Some of these risk factors are under your control, including: Smoking. Not getting enough exercise or physical activity. Being overweight. Having too much fat, sugar, calories, or salt (sodium) in your diet. Drinking too much alcohol. Other risk factors include: Having a personal history of heart disease, diabetes, high cholesterol, or kidney disease. Stress. Having a family history of high blood pressure and high cholesterol. Having obstructive sleep apnea. Age. The risk increases with age. What are the signs or symptoms? High blood pressure may not cause symptoms. Very high blood pressure (hypertensive crisis) may cause: Headache. Fast or irregular heartbeats (palpitations). Shortness of breath. Nosebleed. Nausea and vomiting. Vision changes. Severe chest pain, dizziness, and seizures. How is this diagnosed? This condition is diagnosed by  measuring your blood pressure while you are seated, with your arm resting on a flat surface, your legs uncrossed, and your feet flat on the floor. The cuff of the blood pressure monitor will be placed directly against the skin of your upper arm at the level of your heart. Blood pressure should be measured at least twice using the same arm. Certain conditions can cause a difference in blood pressure between your right and left arms. If you have a high blood pressure reading during one visit or you have normal blood pressure with other risk factors, you may be asked to: Return on a different day to have your blood pressure checked again. Monitor your blood pressure at home for 1 week or longer. If you are diagnosed with hypertension, you may have other blood or imaging tests to help your health care provider understand your overall risk for other conditions. How is this treated? This condition is treated by making healthy lifestyle changes, such as eating healthy foods, exercising more, and reducing your alcohol intake. You may be referred for counseling on a healthy diet and physical activity. Your health care provider may prescribe medicine if lifestyle changes are not enough to get your blood pressure under control and if: Your systolic blood pressure is above 130. Your diastolic blood pressure is above 80. Your personal target blood pressure may vary depending on your medical conditions, your age, and other factors. Follow these instructions at home: Eating and drinking  Eat a diet that is high in fiber and potassium, and low in sodium, added sugar, and fat. An example of this eating plan is called the DASH diet. DASH stands for Dietary Approaches to Stop Hypertension. To eat this way: Eat   plenty of fresh fruits and vegetables. Try to fill one half of your plate at each meal with fruits and vegetables. Eat whole grains, such as whole-wheat pasta, brown rice, or whole-grain bread. Fill about one  fourth of your plate with whole grains. Eat or drink low-fat dairy products, such as skim milk or low-fat yogurt. Avoid fatty cuts of meat, processed or cured meats, and poultry with skin. Fill about one fourth of your plate with lean proteins, such as fish, chicken without skin, beans, eggs, or tofu. Avoid pre-made and processed foods. These tend to be higher in sodium, added sugar, and fat. Reduce your daily sodium intake. Many people with hypertension should eat less than 1,500 mg of sodium a day. Do not drink alcohol if: Your health care provider tells you not to drink. You are pregnant, may be pregnant, or are planning to become pregnant. If you drink alcohol: Limit how much you have to: 0-1 drink a day for women. 0-2 drinks a day for men. Know how much alcohol is in your drink. In the [place] one drink equals one 12 oz bottle of beer (355 mL), one 5 oz glass of wine (148 mL), or one 1 oz glass of hard liquor (44 mL). Lifestyle  Work with your health care provider to maintain a healthy body weight or to lose weight. Ask what an ideal weight is for you. Get at least 30 minutes of exercise that causes your heart to beat faster (aerobic exercise) most days of the week. Activities may include walking, swimming, or biking. Include exercise to strengthen your muscles (resistance exercise), such as Pilates or lifting weights, as part of your weekly exercise routine. Try to do these types of exercises for 30 minutes at least 3 days a week. Do not use any products that contain nicotine or tobacco. These products include cigarettes, chewing tobacco, and vaping devices, such as e-cigarettes. If you need help quitting, ask your health care provider. Monitor your blood pressure at home as told by your health care provider. Keep all follow-up visits. This is important. Medicines Take over-the-counter and prescription medicines only as told by your health care provider. Follow directions carefully. Blood  pressure medicines must be taken as prescribed. Do not skip doses of blood pressure medicine. Doing this puts you at risk for problems and can make the medicine less effective. Ask your health care provider about side effects or reactions to medicines that you should watch for. Contact a health care provider if you: Think you are having a reaction to a medicine you are taking. Have headaches that keep coming back (recurring). Feel dizzy. Have swelling in your ankles. Have trouble with your vision. Get help right away if you: Develop a severe headache or confusion. Have unusual weakness or numbness. Feel faint. Have severe pain in your chest or abdomen. Vomit repeatedly. Have trouble breathing. These symptoms may be an emergency. Get help right away. Call 911. Do not wait to see if the symptoms will go away. Do not drive yourself to the hospital. Summary Hypertension is when the force of blood pumping through your arteries is too strong. If this condition is not controlled, it may put you at risk for serious complications. Your personal target blood pressure may vary depending on your medical conditions, your age, and other factors. For most people, a normal blood pressure is less than 120/80. Hypertension is treated with lifestyle changes, medicines, or a combination of both. Lifestyle changes include losing weight, eating a healthy,   low-sodium diet, exercising more, and limiting alcohol. This information is not intended to replace advice given to you by your health care provider. Make sure you discuss any questions you have with your health care provider. Document Revised: 06/18/2021 Document Reviewed: 06/18/2021 Elsevier Patient Education  2023 [employer]  

## 2022-09-23 ENCOUNTER — Encounter: Payer: Self-pay | Admitting: Internal Medicine

## 2022-10-20 ENCOUNTER — Encounter: Payer: Self-pay | Admitting: *Deleted

## 2023-02-18 ENCOUNTER — Encounter (HOSPITAL_COMMUNITY): Payer: Self-pay | Admitting: *Deleted

## 2023-02-18 ENCOUNTER — Ambulatory Visit (HOSPITAL_COMMUNITY)
Admission: EM | Admit: 2023-02-18 | Discharge: 2023-02-18 | Disposition: A | Discharge status: Home / Self Care | Payer: Medicare PPO | Attending: Emergency Medicine | Admitting: Emergency Medicine

## 2023-02-18 ENCOUNTER — Other Ambulatory Visit: Payer: Self-pay

## 2023-02-18 DIAGNOSIS — N368 Other specified disorders of urethra: Secondary | ICD-10-CM | POA: Diagnosis not present

## 2023-02-18 DIAGNOSIS — R140 Abdominal distension (gaseous): Secondary | ICD-10-CM | POA: Diagnosis not present

## 2023-02-18 LAB — POCT URINALYSIS DIP (MANUAL ENTRY)
Bilirubin, UA: NEGATIVE
Blood, UA: NEGATIVE
Glucose, UA: NEGATIVE mg/dL
Ketones, POC UA: NEGATIVE mg/dL
Leukocytes, UA: NEGATIVE
Nitrite, UA: NEGATIVE
Protein Ur, POC: NEGATIVE mg/dL
Spec Grav, UA: 1.01 (ref 1.010–1.025)
Urobilinogen, UA: 2 E.U./dL — AB
pH, UA: 7 (ref 5.0–8.0)

## 2023-02-18 MED ORDER — LIDOCAINE VISCOUS HCL 2 % MT SOLN
OROMUCOSAL | Status: AC
  Filled 2023-02-18: qty 15

## 2023-02-18 MED ORDER — LIDOCAINE VISCOUS HCL 2 % MT SOLN
15.0000 mL | Freq: Once | OROMUCOSAL | Status: AC
  Administered 2023-02-18: 15 mL via OROMUCOSAL

## 2023-02-18 MED ORDER — ALUM & MAG HYDROXIDE-SIMETH 200-200-20 MG/5ML PO SUSP
30.0000 mL | Freq: Once | ORAL | Status: AC
  Administered 2023-02-18: 30 mL via ORAL

## 2023-02-18 MED ORDER — ALUM & MAG HYDROXIDE-SIMETH 200-200-20 MG/5ML PO SUSP
ORAL | Status: AC
  Filled 2023-02-18: qty 30

## 2023-02-18 MED ORDER — SIMETHICONE 80 MG PO CHEW: 80.0000 mg | CHEWABLE_TABLET | Freq: Four times a day (QID) | ORAL | 0 refills | Status: AC | PRN

## 2023-02-18 NOTE — ED Provider Notes (Signed)
[facility]    CSN: [id number] Arrival date & time: 02/18/23  5176      History   Chief Complaint Chief Complaint  Patient presents with   Stomach Problems    HPI Holly Fowler is a 67 y.o. female.   Patient presents to clinic for abdominal bloating and gas since last week.  She noticed that this started after she had a hamburger eating out, feels like her abdomen is growling and turning.  She denies any abdominal pain, nausea, vomiting or diarrhea.  She has been having increase in her belching and passing gas.  Overall her appetite has been diminished, she did have chicken noodle soup yesterday.  Reports normal bowel movements without straining.  Denies any regular or frequent NSAID use, she has any aches or pain she uses Tylenol 500s.  Patient has no history of acid reflux, denies any chest pain, shortness of breath or burning in her esophagus.  Patient would also like evaluation for urinary tract infection.  She accidentally bought a toilet paper that is scented, since then she has had urethral irritation after voiding.  Denies any dysuria, odor, fevers or flank pain.  Denies any changes to vaginal discharge or vaginal odor.    The history is provided by the patient and medical records.    Past Medical History:  Diagnosis Date   Hypertension     Patient Active Problem List   Diagnosis Date Noted   Anemia due to acquired thiamine deficiency 04/04/2021   Need for vaccination 02/19/2021   Hearing loss of right ear due to cerumen impaction 05/22/2020   Primary osteoarthritis of both hips 10/21/2018   Allergic rhinitis due to allergen 10/20/2016   Tobacco abuse disorder 03/13/2016   Visit for screening mammogram 03/13/2016   Leukocytosis 08/21/2015   Hyperlipidemia with target LDL less than 100 02/20/2015   Kidney disease, chronic, stage III (GFR 30-59 ml/min) (HCC) 08/29/2014   Routine general medical examination at a health care facility 02/17/2014   Eczema  01/11/2013   Obesity, unspecified 01/11/2013   Smoker unmotivated to quit 01/11/2013   Essential hypertension 01/11/2013    Past Surgical History:  Procedure Laterality Date   ADENOIDECTOMY  08/25/1969   TOTAL HIP ARTHROPLASTY Right     OB History   No obstetric history on file.      Home Medications    Prior to Admission medications   Medication Sig Start Date End Date Taking? Authorizing Provider  atorvastatin (LIPITOR) 40 MG tablet Take 1 tablet (40 mg total) by mouth daily. 09/11/22  Yes Etta Grandchild, MD  indapamide (LOZOL) 1.25 MG tablet Take 1 tablet (1.25 mg total) by mouth daily. 09/11/22  Yes Etta Grandchild, MD  simethicone (GAS-X) 80 MG chewable tablet Chew 1 tablet (80 mg total) by mouth every 6 (six) hours as needed for flatulence. 02/18/23  Yes Rinaldo Ratel, [place], FNP  verapamil (CALAN-SR) 240 MG CR tablet Take 1 tablet (240 mg total) by mouth at bedtime. 09/11/22  Yes Etta Grandchild, MD  levocetirizine (XYZAL) 5 MG tablet Take 1 tablet (5 mg total) by mouth every evening. 09/11/22   Etta Grandchild, MD    Family History Family History  Problem Relation Age of Onset   Hypertension Mother    Hyperlipidemia Mother    Hypertension Father    Hyperlipidemia Father    Hypertension Sister    Hyperlipidemia Sister    Hypertension Brother    Hyperlipidemia Brother  Cancer Neg Hx    Diabetes Neg Hx    Stroke Neg Hx    Early death Neg Hx    Heart disease Neg Hx    Kidney disease Neg Hx     Social History Social History   Tobacco Use   Smoking status: Every Day    Packs/day: 0.50    Years: 40.00    Additional pack years: 0.00    Total pack years: 20.00    Types: Cigarettes    Passive exposure: Current   Smokeless tobacco: Never  Vaping Use   Vaping Use: Never used  Substance Use Topics   Alcohol use: No   Drug use: No     Allergies   Penicillins   Review of Systems Review of Systems  Constitutional:  Negative for fever.  Gastrointestinal:   Positive for abdominal distention. Negative for abdominal pain, diarrhea, nausea and vomiting.  Genitourinary:  Negative for decreased urine volume, dysuria and vaginal discharge.     Physical Exam Triage Vital Signs ED Triage Vitals  Enc Vitals Group     BP 02/18/23 0825 (!) 152/86     Pulse Rate 02/18/23 0825 82     Resp 02/18/23 0825 20     Temp 02/18/23 0825 97.9 F (36.6 C)     Temp Source 02/18/23 0825 Oral     SpO2 02/18/23 0825 96 %     Weight --      Height --      Head Circumference --      Peak Flow --      Pain Score 02/18/23 0828 0     Pain Loc --      Pain Edu? --      Excl. in GC? --    No data found.  Updated Vital Signs BP (!) 152/86   Pulse 82   Temp 97.9 F (36.6 C) (Oral)   Resp 20   SpO2 96%   Visual Acuity Right Eye Distance:   Left Eye Distance:   Bilateral Distance:    Right Eye Near:   Left Eye Near:    Bilateral Near:     Physical Exam Vitals and nursing note reviewed.  Constitutional:      Appearance: Normal appearance.  HENT:     Head: Normocephalic and atraumatic.     Right Ear: External ear normal.     Left Ear: External ear normal.     Nose: Nose normal.     Mouth/Throat:     Mouth: Mucous membranes are moist.  Eyes:     Conjunctiva/sclera: Conjunctivae normal.  Cardiovascular:     Rate and Rhythm: Normal rate and regular rhythm.     Heart sounds: Normal heart sounds, S1 normal and S2 normal. No murmur heard. Pulmonary:     Effort: Pulmonary effort is normal. No respiratory distress.     Breath sounds: Normal breath sounds.  Abdominal:     General: Abdomen is flat. Bowel sounds are normal. There is no distension.     Palpations: Abdomen is soft. There is no mass.     Tenderness: There is no abdominal tenderness. There is no right CVA tenderness, left CVA tenderness, guarding or rebound.     Hernia: No hernia is present.  Musculoskeletal:        General: No swelling. Normal range of motion.  Skin:    General: Skin  is warm and dry.  Neurological:     General: No focal deficit present.  Mental Status: She is alert and oriented to person, place, and time.  Psychiatric:        Mood and Affect: Mood normal.        Behavior: Behavior is cooperative.      UC Treatments / Results  Labs (all labs ordered are listed, but only abnormal results are displayed) Labs Reviewed  POCT URINALYSIS DIP (MANUAL ENTRY) - Abnormal; Notable for the following components:      Result Value   Urobilinogen, UA 2.0 (*)    All other components within normal limits    EKG   Radiology No results found.  Procedures Procedures (including critical care time)  Medications Ordered in UC Medications  alum & mag hydroxide-simeth (MAALOX/MYLANTA) 200-200-20 MG/5ML suspension 30 mL (30 mLs Oral Given 02/18/23 0851)  lidocaine (XYLOCAINE) 2 % viscous mouth solution 15 mL (15 mLs Mouth/Throat Given 02/18/23 0851)    Initial Impression / Assessment and Plan / UC Course  I have reviewed the triage vital signs and the nursing notes.  Pertinent labs & imaging results that were available during my care of the patient were reviewed by me and considered in my medical decision making (see chart for details).  Vitals and triage reviewed, patient is hemodynamically stable.  Urethral irritation after using scented toilet paper.  Urinalysis without red blood cells, white blood cells or nitrates. Negative for CVA tenderness.  Suspect urethral irritation due to scented tissue.  Increased belching and gas after eating out last week.  Abdomen is soft and non-tender with active BS.  Without guarding, rebound or tenderness, low concern for acute abdomen.  GI cocktail given, mild improvement in belching.  Discussed increase in flatulence and belching due to dietary triggers.  Bland diet and simethicone as needed.  Encouraged to follow-up with PCP if symptoms persist.  Plan of care, follow-up care and return precautions given, no questions at  this time.     Final Clinical Impressions(s) / UC Diagnoses   Final diagnoses:  Flatulence/gas pain/belching  Irritation of urethral meatus     Discharge Instructions      Your urine was negative for infection.  Please stop using the scented toilet paper, as this is irritating your urethral meatus.   For your flatulence and belching, you can take the simethicone every 6 hours as needed.  I have attached a bland diet for you to follow until your belching and flatulence improved.  If this continues, despite intervention, please follow-up with your primary care provider for further evaluation.  Return to clinic or follow-up with your primary care provider for any new or concerning symptoms.       ED Prescriptions     Medication Sig Dispense Auth. Provider   simethicone (GAS-X) 80 MG chewable tablet Chew 1 tablet (80 mg total) by mouth every 6 (six) hours as needed for flatulence. 30 tablet Levi Klaiber, [place], [place]      PDMP not reviewed this encounter.   Rinaldo Ratel [place] N, [place] 02/18/23 [phone]

## 2023-02-18 NOTE — ED Triage Notes (Addendum)
Reports stomach issues "feels like bloating and stomach growling" onset 1 wk ago. States whenever she eats something she experiences same sxs. Denies any abd pain, "it's just a lot of gas, belching, and aggravating". Denies any n/v/d. Has not taken any measures to help alleviate sxs.  Pt requesting UTI testing for genital irritation after using a new scented toilet tissue. C/O "burning -- not sure if it's from where I pee".

## 2023-02-18 NOTE — Discharge Instructions (Addendum)
Your urine was negative for infection.  Please stop using the scented toilet paper, as this is irritating your urethral meatus.   For your flatulence and belching, you can take the simethicone every 6 hours as needed.  I have attached a bland diet for you to follow until your belching and flatulence improved.  If this continues, despite intervention, please follow-up with your primary care provider for further evaluation.  Return to clinic or follow-up with your primary care provider for any new or concerning symptoms.

## 2023-02-24 ENCOUNTER — Ambulatory Visit (HOSPITAL_COMMUNITY)
Admission: EM | Admit: 2023-02-24 | Discharge: 2023-02-24 | Disposition: A | Discharge status: Home / Self Care | Payer: Medicare PPO | Attending: Family Medicine | Admitting: Family Medicine

## 2023-02-24 ENCOUNTER — Encounter (HOSPITAL_COMMUNITY): Payer: Self-pay

## 2023-02-24 DIAGNOSIS — R202 Paresthesia of skin: Secondary | ICD-10-CM

## 2023-02-24 DIAGNOSIS — R200 Anesthesia of skin: Secondary | ICD-10-CM

## 2023-02-24 NOTE — Discharge Instructions (Signed)
Please proceed to the emergency room.

## 2023-02-24 NOTE — ED Triage Notes (Signed)
Pt c/o lt arm numbness and tingling from elbow to fingers since Saturday. Denies injury. States took tylenol yesterday with little relief.

## 2023-02-24 NOTE — ED Provider Notes (Signed)
[facility]    CSN: [id number] Arrival date & time: 02/24/23  4098      History   Chief Complaint Chief Complaint  Patient presents with   Arm Pain    HPI Holly Fowler is a 67 y.o. female.    Arm Pain   Here for numbness and tingling in her left forearm and hand.  It began bothering her on June 29, and it has been waxing and waning ever since.  Is more persistent in the last 24 hours.  No headache and no muscle weakness noted.  Not really having any neck pain.  The tingling is the entire forearm and hand, and is not just lateral or medial.  Blood pressure here initially is 153/90    Past Medical History:  Diagnosis Date   Hypertension     Patient Active Problem List   Diagnosis Date Noted   Anemia due to acquired thiamine deficiency 04/04/2021   Need for vaccination 02/19/2021   Hearing loss of right ear due to cerumen impaction 05/22/2020   Primary osteoarthritis of both hips 10/21/2018   Allergic rhinitis due to allergen 10/20/2016   Tobacco abuse disorder 03/13/2016   Visit for screening mammogram 03/13/2016   Leukocytosis 08/21/2015   Hyperlipidemia with target LDL less than 100 02/20/2015   Kidney disease, chronic, stage III (GFR 30-59 ml/min) (HCC) 08/29/2014   Routine general medical examination at a health care facility 02/17/2014   Eczema 01/11/2013   Obesity, unspecified 01/11/2013   Smoker unmotivated to quit 01/11/2013   Essential hypertension 01/11/2013    Past Surgical History:  Procedure Laterality Date   ADENOIDECTOMY  08/25/1969   TOTAL HIP ARTHROPLASTY Right     OB History   No obstetric history on file.      Home Medications    Prior to Admission medications   Medication Sig Start Date End Date Taking? Authorizing Provider  atorvastatin (LIPITOR) 40 MG tablet Take 1 tablet (40 mg total) by mouth daily. 09/11/22   Etta Grandchild, MD  indapamide (LOZOL) 1.25 MG tablet Take 1 tablet (1.25 mg total) by mouth daily.  09/11/22   Etta Grandchild, MD  levocetirizine (XYZAL) 5 MG tablet Take 1 tablet (5 mg total) by mouth every evening. 09/11/22   Etta Grandchild, MD  simethicone (GAS-X) 80 MG chewable tablet Chew 1 tablet (80 mg total) by mouth every 6 (six) hours as needed for flatulence. 02/18/23   [place], [place] N, FNP  verapamil (CALAN-SR) 240 MG CR tablet Take 1 tablet (240 mg total) by mouth at bedtime. 09/11/22   Etta Grandchild, MD    Family History Family History  Problem Relation Age of Onset   Hypertension Mother    Hyperlipidemia Mother    Hypertension Father    Hyperlipidemia Father    Hypertension Sister    Hyperlipidemia Sister    Hypertension Brother    Hyperlipidemia Brother    Cancer Neg Hx    Diabetes Neg Hx    Stroke Neg Hx    Early death Neg Hx    Heart disease Neg Hx    Kidney disease Neg Hx     Social History Social History   Tobacco Use   Smoking status: Every Day    Packs/day: 0.50    Years: 40.00    Additional pack years: 0.00    Total pack years: 20.00    Types: Cigarettes    Passive exposure: Current   Smokeless tobacco: Never  Vaping Use   Vaping Use: Never used  Substance Use Topics   Alcohol use: No   Drug use: No     Allergies   Penicillins   Review of Systems Review of Systems   Physical Exam Triage Vital Signs ED Triage Vitals  Enc Vitals Group     BP 02/24/23 0832 (!) 153/90     Pulse Rate 02/24/23 0832 73     Resp 02/24/23 0832 18     Temp 02/24/23 0832 98 F (36.7 C)     Temp Source 02/24/23 0832 Oral     SpO2 02/24/23 0832 95 %     Weight --      Height --      Head Circumference --      Peak Flow --      Pain Score 02/24/23 0833 0     Pain Loc --      Pain Edu? --      Excl. in GC? --    No data found.  Updated Vital Signs BP (!) 153/90 (BP Location: Right Arm)   Pulse 73   Temp 98 F (36.7 C) (Oral)   Resp 18   SpO2 95%   Visual Acuity Right Eye Distance:   Left Eye Distance:   Bilateral Distance:    Right  Eye Near:   Left Eye Near:    Bilateral Near:     Physical Exam Vitals reviewed.  Constitutional:      General: She is not in acute distress.    Appearance: She is not ill-appearing, toxic-appearing or diaphoretic.  Cardiovascular:     Rate and Rhythm: Normal rate and regular rhythm.     Heart sounds: No murmur heard. Pulmonary:     Effort: Pulmonary effort is normal.     Breath sounds: Normal breath sounds.  Skin:    Coloration: Skin is not pale.  Neurological:     Mental Status: She is alert and oriented to person, place, and time.     Comments: There is decreased light touch sensation of the forearm on the left, and a nondermatomal distribution.  Psychiatric:        Behavior: Behavior normal.      UC Treatments / Results  Labs (all labs ordered are listed, but only abnormal results are displayed) Labs Reviewed - No data to display  EKG   Radiology No results found.  Procedures Procedures (including critical care time)  Medications Ordered in UC Medications - No data to display  Initial Impression / Assessment and Plan / UC Course  I have reviewed the triage vital signs and the nursing notes.  Pertinent labs & imaging results that were available during my care of the patient were reviewed by me and considered in my medical decision making (see chart for details).        Though I suspect that this is more of a peripheral nerve compression, it is nondermatomal and her blood pressure is elevated.  I have asked her to proceed to the emergency room for higher level of evaluation and treatment then we can provide in the urgent care setting.  She will proceed private car Final Clinical Impressions(s) / UC Diagnoses   Final diagnoses:  None   Discharge Instructions   None    ED Prescriptions   None    I have reviewed the PDMP during this encounter.   Zenia Resides, MD 02/24/23 [phone]

## 2023-02-25 ENCOUNTER — Ambulatory Visit: Payer: Medicare PPO | Admitting: Family Medicine

## 2023-02-25 ENCOUNTER — Encounter: Payer: Self-pay | Admitting: Family Medicine

## 2023-02-25 ENCOUNTER — Ambulatory Visit (INDEPENDENT_AMBULATORY_CARE_PROVIDER_SITE_OTHER): Payer: Medicare PPO

## 2023-02-25 VITALS — BP 142/82 | HR 90 | Temp 97.3°F | Ht 63.0 in | Wt 184.0 lb

## 2023-02-25 DIAGNOSIS — R200 Anesthesia of skin: Secondary | ICD-10-CM

## 2023-02-25 DIAGNOSIS — M542 Cervicalgia: Secondary | ICD-10-CM

## 2023-02-25 DIAGNOSIS — D538 Other specified nutritional anemias: Secondary | ICD-10-CM | POA: Diagnosis not present

## 2023-02-25 DIAGNOSIS — M47812 Spondylosis without myelopathy or radiculopathy, cervical region: Secondary | ICD-10-CM | POA: Diagnosis not present

## 2023-02-25 DIAGNOSIS — R202 Paresthesia of skin: Secondary | ICD-10-CM | POA: Diagnosis not present

## 2023-02-25 DIAGNOSIS — I10 Essential (primary) hypertension: Secondary | ICD-10-CM

## 2023-02-25 DIAGNOSIS — E519 Thiamine deficiency, unspecified: Secondary | ICD-10-CM

## 2023-02-25 DIAGNOSIS — M4802 Spinal stenosis, cervical region: Secondary | ICD-10-CM | POA: Diagnosis not present

## 2023-02-25 LAB — CBC WITH DIFFERENTIAL/PLATELET
Basophils Absolute: 0.1 10*3/uL (ref 0.0–0.1)
Basophils Relative: 1.1 % (ref 0.0–3.0)
Eosinophils Absolute: 0.1 10*3/uL (ref 0.0–0.7)
Eosinophils Relative: 0.4 % (ref 0.0–5.0)
HCT: 40.9 % (ref 36.0–46.0)
Hemoglobin: 13.1 g/dL (ref 12.0–15.0)
Lymphocytes Relative: 21 % (ref 12.0–46.0)
Lymphs Abs: 2.7 10*3/uL (ref 0.7–4.0)
MCHC: 32 g/dL (ref 30.0–36.0)
MCV: 81.6 fl (ref 78.0–100.0)
Monocytes Absolute: 0.7 10*3/uL (ref 0.1–1.0)
Monocytes Relative: 5.2 % (ref 3.0–12.0)
Neutro Abs: 9.4 10*3/uL — ABNORMAL HIGH (ref 1.4–7.7)
Neutrophils Relative %: 72.3 % (ref 43.0–77.0)
Platelets: 391 10*3/uL (ref 150.0–400.0)
RBC: 5.01 Mil/uL (ref 3.87–5.11)
RDW: 14.6 % (ref 11.5–15.5)
WBC: 13 10*3/uL — ABNORMAL HIGH (ref 4.0–10.5)

## 2023-02-25 LAB — COMPREHENSIVE METABOLIC PANEL
ALT: 13 U/L (ref 0–35)
AST: 18 U/L (ref 0–37)
Albumin: 4.4 g/dL (ref 3.5–5.2)
Alkaline Phosphatase: 184 U/L — ABNORMAL HIGH (ref 39–117)
BUN: 17 mg/dL (ref 6–23)
CO2: 28 mEq/L (ref 19–32)
Calcium: 10.1 mg/dL (ref 8.4–10.5)
Chloride: 100 mEq/L (ref 96–112)
Creatinine, Ser: 1.19 mg/dL (ref 0.40–1.20)
GFR: 47.52 mL/min — ABNORMAL LOW (ref >60.00)
Glucose, Bld: 99 mg/dL (ref 70–99)
Potassium: 4 mEq/L (ref 3.5–5.1)
Sodium: 136 mEq/L (ref 135–145)
Total Bilirubin: 0.6 mg/dL (ref 0.2–1.2)
Total Protein: 8.1 g/dL (ref 6.0–8.3)

## 2023-02-25 LAB — C-REACTIVE PROTEIN: CRP: <1 mg/dL (ref 0.5–20.0)

## 2023-02-25 LAB — VITAMIN B12: Vitamin B-12: 325 pg/mL (ref 211–911)

## 2023-02-25 LAB — SEDIMENTATION RATE: Sed Rate: 62 mm/hr — ABNORMAL HIGH (ref 0–30)

## 2023-02-25 LAB — TSH: TSH: 0.77 u[IU]/mL (ref 0.35–5.50)

## 2023-02-25 NOTE — Progress Notes (Signed)
Subjective:     Patient ID: Holly Fowler, female    DOB: Jan 21, 1956, 67 y.o.   MRN: [id number]  Chief Complaint  Patient presents with   Tingling    Saturday left arm tingling from elbow to hand, did see urgent care for this    HPI  Discussed the use of AI scribe software for clinical note transcription with the patient, who gave verbal consent to proceed.  History of Present Illness         C/o intermittent numbness and tingling of her posterior left forearm and her whole hand. No weakness. Still using her left arm and hand as usual. No injury.   She noticed tightness in the left side of her neck this morning.   Does not notice it with sleeping.   She leans on her left elbow often. Sleeps with her left arm above her head.      Health Maintenance Due  Topic Date Due   Lung Cancer Screening  Never done   Zoster Vaccines- Shingrix (1 of 2) Never done   DEXA SCAN  Never done   COVID-19 Vaccine (3 - 2023-24 season) 04/25/2022   DTaP/Tdap/Td (2 - Td or Tdap) 01/12/2023    Past Medical History:  Diagnosis Date   Hypertension     Past Surgical History:  Procedure Laterality Date   ADENOIDECTOMY  08/25/1969   TOTAL HIP ARTHROPLASTY Right     Family History  Problem Relation Age of Onset   Hypertension Mother    Hyperlipidemia Mother    Hypertension Father    Hyperlipidemia Father    Hypertension Sister    Hyperlipidemia Sister    Hypertension Brother    Hyperlipidemia Brother    Cancer Neg Hx    Diabetes Neg Hx    Stroke Neg Hx    Early death Neg Hx    Heart disease Neg Hx    Kidney disease Neg Hx     Social History   Socioeconomic History   Marital status: Married    Spouse name: Not on file   Number of children: Not on file   Years of education: 16   Highest education level: Not on file  Occupational History   Occupation: Retired    Associate Professor: [employer]    Comment: Teacher  Tobacco Use   Smoking status: Every Day    Packs/day:  0.50    Years: 40.00    Additional pack years: 0.00    Total pack years: 20.00    Types: Cigarettes    Passive exposure: Current   Smokeless tobacco: Never  Vaping Use   Vaping Use: Never used  Substance and Sexual Activity   Alcohol use: No   Drug use: No   Sexual activity: Not on file  Other Topics Concern   Not on file  Social History Narrative   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 She lives with her husband and is caring for her father who has alzheimer's.   Social Determinants of Health   Financial Resource Strain: Low Risk  (09/02/2022)   Overall Financial Resource Strain (CARDIA)    Difficulty of Paying Living Expenses: Not hard at all  Food Insecurity: No Food Insecurity (09/02/2022)   Hunger Vital Sign    Worried About Running Out of Food in the Last Year: Never true    Ran Out of Food in the Last Year: Never true  Transportation Needs: No Transportation Needs (09/02/2022)   PRAPARE - Transportation  Lack of Transportation (Medical): No    Lack of Transportation (Non-Medical): No  Physical Activity: Inactive (09/02/2022)   Exercise Vital Sign    Days of Exercise per Week: 0 days    Minutes of Exercise per Session: 0 min  Stress: No Stress Concern Present (09/02/2022)   [employer] of Occupational Health - Occupational Stress Questionnaire    Feeling of Stress : Not at all  Social Connections: Socially Integrated (09/02/2022)   Social Connection and Isolation Panel [NHANES]    Frequency of Communication with Friends and Family: More than three times a week    Frequency of Social Gatherings with Friends and Family: More than three times a week    Attends Religious Services: More than 4 times per year    Active Member of [employer] or Organizations: Yes    Attends Engineer, structural: More than 4 times per year    Marital Status: Married  Catering manager Violence: Not At Risk (09/02/2022)   Humiliation, Afraid, Rape, and Kick questionnaire    Fear of Current or  Ex-Partner: No    Emotionally Abused: No    Physically Abused: No    Sexually Abused: No    Outpatient Medications Prior to Visit  Medication Sig Dispense Refill   atorvastatin (LIPITOR) 40 MG tablet Take 1 tablet (40 mg total) by mouth daily. 90 tablet 1   indapamide (LOZOL) 1.25 MG tablet Take 1 tablet (1.25 mg total) by mouth daily. 90 tablet 1   levocetirizine (XYZAL) 5 MG tablet Take 1 tablet (5 mg total) by mouth every evening. 90 tablet 3   simethicone (GAS-X) 80 MG chewable tablet Chew 1 tablet (80 mg total) by mouth every 6 (six) hours as needed for flatulence. 30 tablet 0   verapamil (CALAN-SR) 240 MG CR tablet Take 1 tablet (240 mg total) by mouth at bedtime. 90 tablet 1   No facility-administered medications prior to visit.    Allergies  Allergen Reactions   Penicillins     States years ago - unk reaction    Review of Systems  Constitutional:  Negative for chills, fever, malaise/fatigue and weight loss.  Respiratory:  Negative for shortness of breath.   Cardiovascular:  Negative for chest pain, palpitations and leg swelling.  Gastrointestinal:  Negative for abdominal pain, constipation, diarrhea, nausea and vomiting.  Genitourinary:  Negative for dysuria, frequency and urgency.  Musculoskeletal:  Positive for back pain, joint pain, myalgias and neck pain.  Skin:  Negative for rash.  Neurological:  Positive for sensory change. Negative for dizziness, tremors, speech change, focal weakness and headaches.  Psychiatric/Behavioral:  Negative for depression. The patient is not nervous/anxious.        Objective:    Physical Exam Constitutional:      General: She is not in acute distress.    Appearance: She is not ill-appearing.  HENT:     Mouth/Throat:     Mouth: Mucous membranes are moist.     Pharynx: Oropharynx is clear.  Eyes:     General: No visual field deficit.    Extraocular Movements: Extraocular movements intact.     Conjunctiva/sclera: Conjunctivae  normal.     Pupils: Pupils are equal, round, and reactive to light.  Cardiovascular:     Rate and Rhythm: Normal rate and regular rhythm.  Pulmonary:     Effort: Pulmonary effort is normal.     Breath sounds: Normal breath sounds.  Musculoskeletal:     Cervical back: Normal range of motion  and neck supple. No tenderness.     Right lower leg: No edema.     Left lower leg: No edema.  Lymphadenopathy:     Cervical: No cervical adenopathy.  Skin:    General: Skin is warm and dry.  Neurological:     General: No focal deficit present.     Mental Status: She is alert and oriented to person, place, and time.     Cranial Nerves: No facial asymmetry.     Sensory: Sensation is intact.     Motor: No weakness, tremor or pronator drift.     Coordination: Romberg sign negative. Coordination normal. Finger-Nose-Finger Test normal.     Gait: Gait is intact.  Psychiatric:        Attention and Perception: Attention normal.        Mood and Affect: Mood normal.        Speech: Speech normal.        Behavior: Behavior normal.        Thought Content: Thought content normal.      BP (!) 142/82 (BP Location: Left Arm, Patient Position: Sitting, Cuff Size: Large)   Pulse 90   Temp (!) 97.3 F (36.3 C) (Temporal)   Ht 5\' 3"  (1.6 m)   Wt 184 lb (83.5 kg)   SpO2 97%   BMI 32.59 kg/m  Wt Readings from Last 3 Encounters:  02/25/23 184 lb (83.5 kg)  09/11/22 197 lb (89.4 kg)  09/02/22 187 lb (84.8 kg)        Assessment & Plan:   Problem List Items Addressed This Visit       Cardiovascular and Mediastinum   Essential hypertension   Relevant Orders   CBC with Differential/Platelet (Completed)   Comprehensive metabolic panel (Completed)     Other   Anemia due to acquired thiamine deficiency   Relevant Orders   CBC with Differential/Platelet (Completed)   Iron, TIBC and Ferritin Panel   Vitamin B1   Other Visit Diagnoses     Numbness and tingling in left arm    -  Primary   Relevant  Orders   CBC with Differential/Platelet (Completed)   Vitamin B12 (Completed)   Iron, TIBC and Ferritin Panel   TSH (Completed)   Comprehensive metabolic panel (Completed)   Sedimentation rate (Completed)   C-reactive protein (Completed)   DG Cervical Spine Complete (Completed)   Neck pain on left side       Relevant Orders   Sedimentation rate (Completed)   C-reactive protein (Completed)   DG Cervical Spine Complete (Completed)      She is not in any acute distress.  No red flag symptoms. Benign neurological exam. Cervical spine x-ray and labs ordered. History of thiamine deficiency and states she cannot tolerate her thiamine supplement.  She prefers to not take it. We discussed strokelike symptoms and when to call 911. She will follow-up with her PCP and pending results from today's visit.  I am having Holly Fowler maintain her atorvastatin, indapamide, levocetirizine, verapamil, and simethicone.  No orders of the defined types were placed in this encounter.

## 2023-02-25 NOTE — Patient Instructions (Signed)
Please go downstairs for an x-ray of your neck and for blood work.  Avoid leaning on your left elbow when you are sitting.  Be aware of how you are left arm is positioned when you are sleeping.  If you develop any new numbness or tingling or if you have weak nests then you should call 911 or go to the emergency department.  We will be in touch with your lab results.  Follow-up with Dr. Yetta Barre as scheduled.

## 2023-02-27 NOTE — Progress Notes (Signed)
Does she need an orthopedist referral? Please see my note.

## 2023-03-03 LAB — IRON,TIBC AND FERRITIN PANEL
%SAT: 10 % (calc) — ABNORMAL LOW (ref 16–45)
Ferritin: 121 ng/mL (ref 16–288)
Iron: 39 ug/dL — ABNORMAL LOW (ref 45–160)
TIBC: 384 mcg/dL (calc) (ref 250–450)

## 2023-03-03 LAB — VITAMIN B1: Vitamin B1 (Thiamine): 6 nmol/L — ABNORMAL LOW (ref 8–30)

## 2023-03-16 DIAGNOSIS — M1612 Unilateral primary osteoarthritis, left hip: Secondary | ICD-10-CM | POA: Diagnosis not present

## 2023-03-19 ENCOUNTER — Ambulatory Visit: Payer: Medicare PPO | Admitting: Internal Medicine

## 2023-03-19 ENCOUNTER — Telehealth: Payer: Self-pay | Admitting: Internal Medicine

## 2023-03-19 ENCOUNTER — Encounter: Payer: Self-pay | Admitting: Internal Medicine

## 2023-03-19 VITALS — BP 146/68 | HR 77 | Temp 98.1°F | Resp 16 | Ht 63.0 in | Wt 194.0 lb

## 2023-03-19 DIAGNOSIS — I10 Essential (primary) hypertension: Secondary | ICD-10-CM | POA: Diagnosis not present

## 2023-03-19 DIAGNOSIS — E785 Hyperlipidemia, unspecified: Secondary | ICD-10-CM

## 2023-03-19 DIAGNOSIS — R9431 Abnormal electrocardiogram [ECG] [EKG]: Secondary | ICD-10-CM

## 2023-03-19 DIAGNOSIS — Z720 Tobacco use: Secondary | ICD-10-CM

## 2023-03-19 DIAGNOSIS — E7841 Elevated Lipoprotein(a): Secondary | ICD-10-CM

## 2023-03-19 DIAGNOSIS — D538 Other specified nutritional anemias: Secondary | ICD-10-CM

## 2023-03-19 DIAGNOSIS — Z0000 Encounter for general adult medical examination without abnormal findings: Secondary | ICD-10-CM | POA: Diagnosis not present

## 2023-03-19 DIAGNOSIS — E519 Thiamine deficiency, unspecified: Secondary | ICD-10-CM

## 2023-03-19 LAB — LIPID PANEL
Cholesterol: 139 mg/dL (ref 0–200)
HDL: 48.1 mg/dL (ref >39.00)
LDL Cholesterol: 73 mg/dL (ref 0–99)
NonHDL: 90.64
Total CHOL/HDL Ratio: 3
Triglycerides: 88 mg/dL (ref 0.0–149.0)
VLDL: 17.6 mg/dL (ref 0.0–40.0)

## 2023-03-19 LAB — CK: Total CK: 73 U/L (ref 7–177)

## 2023-03-19 MED ORDER — BOOSTRIX 5-2.5-18.5 LF-MCG/0.5 IM SUSP
0.5000 mL | Freq: Once | INTRAMUSCULAR | 0 refills | Status: AC
Start: 2023-03-19 — End: 2023-03-19

## 2023-03-19 MED ORDER — SHINGRIX 50 MCG/0.5ML IM SUSR
0.5000 mL | Freq: Once | INTRAMUSCULAR | 1 refills | Status: AC
Start: 2023-03-19 — End: 2023-03-19

## 2023-03-19 NOTE — Telephone Encounter (Signed)
Form given to PCP to review and sign.  

## 2023-03-19 NOTE — Progress Notes (Unsigned)
Subjective:  Patient ID: Holly Fowler, female    DOB: October 14, 1955  Age: 67 y.o. MRN: [id number]  CC: Hypertension, Annual Exam, and Hyperlipidemia   HPI Holly Fowler presents for a CPX and f/up ---  Discussed the use of AI scribe software for clinical note transcription with the patient, who gave verbal consent to proceed.  History of Present Illness   Holly Fowler, with a history of hip pain, presents with recent episodes of arm tingling and a sensation of gas in the back. The symptoms have been ongoing since a bout of food poisoning in early July. The patient denies any associated chest pain, shortness of breath, dizziness, lightheadedness, or blood pressure symptoms. There is no reported difficulty or pain with swallowing, and no heartburn or indigestion. The patient has been managing the gas sensation with Gas-X, although usage has decreased recently.  The patient was evaluated by Dr. Charlann Boxer, an orthopedic doctor, for the hip pain. X-rays were performed, and the patient was advised to take vitamin C. The patient occasionally takes Tylenol for the hip pain, which is reportedly worse on one side. The patient was due for a follow-up last year but did not schedule the appointment.  The patient's last colonoscopy was nine years ago, performed by an unknown provider. The patient denies any presence of blood in the stool.       Outpatient Medications Prior to Visit  Medication Sig Dispense Refill   atorvastatin (LIPITOR) 40 MG tablet Take 1 tablet (40 mg total) by mouth daily. 90 tablet 1   levocetirizine (XYZAL) 5 MG tablet Take 1 tablet (5 mg total) by mouth every evening. 90 tablet 3   simethicone (GAS-X) 80 MG chewable tablet Chew 1 tablet (80 mg total) by mouth every 6 (six) hours as needed for flatulence. 30 tablet 0   indapamide (LOZOL) 1.25 MG tablet Take 1 tablet (1.25 mg total) by mouth daily. 90 tablet 1   verapamil (CALAN-SR) 240 MG CR tablet Take 1 tablet (240 mg total) by mouth at  bedtime. 90 tablet 1   No facility-administered medications prior to visit.    ROS Review of Systems  Constitutional: Negative.  Negative for diaphoresis, fatigue and unexpected weight change.  HENT: Negative.  Negative for trouble swallowing and voice change.   Eyes: Negative.   Respiratory: Negative.  Negative for cough, chest tightness, shortness of breath and wheezing.   Cardiovascular:  Negative for chest pain, palpitations and leg swelling.  Gastrointestinal:  Negative for abdominal pain, blood in stool, constipation, diarrhea, nausea and vomiting.  Endocrine: Negative.   Genitourinary: Negative.  Negative for difficulty urinating, dysuria and urgency.  Musculoskeletal:  Positive for arthralgias. Negative for myalgias and neck pain.  Skin: Negative.   Neurological: Negative.  Negative for dizziness and light-headedness.  Hematological:  Negative for adenopathy. Does not bruise/bleed easily.  Psychiatric/Behavioral: Negative.      Objective:  BP (!) 146/68 (BP Location: Left Arm, Patient Position: Sitting, Cuff Size: Large)   Pulse 77   Temp 98.1 F (36.7 C) (Oral)   Resp 16   Ht 5\' 3"  (1.6 m)   Wt 194 lb (88 kg)   SpO2 94%   BMI 34.37 kg/m   BP Readings from Last 3 Encounters:  03/19/23 (!) 146/68  02/25/23 (!) 142/82  02/24/23 (!) 153/90    Wt Readings from Last 3 Encounters:  03/19/23 194 lb (88 kg)  02/25/23 184 lb (83.5 kg)  09/11/22 197 lb (89.4 kg)  Physical Exam Vitals reviewed.  HENT:     Mouth/Throat:     Mouth: Mucous membranes are moist.  Eyes:     General: No scleral icterus.    Conjunctiva/sclera: Conjunctivae normal.  Cardiovascular:     Rate and Rhythm: Normal rate and regular rhythm.     Heart sounds: Normal heart sounds, S1 normal and S2 normal. No murmur heard.    No friction rub. No gallop.     Comments: EKG- NSR, 71 bpm LAD, LAE , low voltage - old No LVH unchanged Pulmonary:     Effort: Pulmonary effort is normal.      Breath sounds: No stridor. No wheezing, rhonchi or rales.  Abdominal:     General: Abdomen is flat.     Palpations: There is no mass.     Tenderness: There is no abdominal tenderness. There is no guarding.     Hernia: No hernia is present.  Musculoskeletal:        General: Normal range of motion.     Cervical back: Neck supple.     Right lower leg: No edema.     Left lower leg: No edema.  Skin:    General: Skin is warm and dry.  Neurological:     General: No focal deficit present.     Mental Status: She is alert. Mental status is at baseline.  Psychiatric:        Mood and Affect: Mood normal.        Behavior: Behavior normal.     Lab Results  Component Value Date   WBC 13.0 (H) 02/25/2023   HGB 13.1 02/25/2023   HCT 40.9 02/25/2023   PLT 391.0 02/25/2023   GLUCOSE 99 02/25/2023   CHOL 139 03/19/2023   TRIG 88.0 03/19/2023   HDL 48.10 03/19/2023   LDLCALC 73 03/19/2023   ALT 13 02/25/2023   AST 18 02/25/2023   NA 136 02/25/2023   K 4.0 02/25/2023   CL 100 02/25/2023   CREATININE 1.19 02/25/2023   BUN 17 02/25/2023   CO2 28 02/25/2023   TSH 0.77 02/25/2023   HGBA1C 5.5 10/21/2018    DG Cervical Spine Complete  Result Date: 02/25/2023 CLINICAL DATA:  Numbness, tingling of left forearm and hand, left-sided neck pain EXAM: CERVICAL SPINE - COMPLETE 4+ VIEW COMPARISON:  None Available. FINDINGS: There is no evidence of cervical spine fracture or prevertebral soft tissue swelling. Alignment is normal. Multilevel degenerative changes, most prominently at C5-C6 and C6-C7. Uncovertebral and facet arthropathy. Neural foraminal narrowing at C3-C4. 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 Electronically Signed   By: Wiliam Ke M.D.   On: 02/25/2023 14:48    Assessment & Plan:   Hyperlipidemia with target LDL less than 100- LDL goal achieved. Doing well on the statin  -     Lipoprotein A (LPA); Future -     Lipid panel; Future -      CK; Future -     CT CARDIAC SCORING ([facility] LOCATIONS ONLY); Future  Anemia due to acquired thiamine deficiency -     Vitamin B-1; Take 1 tablet (50 mg total) by mouth daily.  Dispense: 90 tablet; Refill: 1  Essential hypertension- EKG is neg for LVH. Her BP is not at goal. Will restart indapamide. -     EKG 12-Lead  Tobacco abuse disorder -     CT CARDIAC SCORING ([facility] LOCATIONS ONLY); Future  Routine general medical examination at a health care facility-  Exam completed, labs reviewed, vaccines reviewed and updated, cancer screenings addressed, pt ed material was given.  -     Boostrix; Inject 0.5 mLs into the muscle once for 1 dose.  Dispense: 0.5 mL; Refill: 0 -     Shingrix; Inject 0.5 mLs into the muscle once for 1 dose.  Dispense: 0.5 mL; Refill: 1  Abnormal electrocardiogram -     CT CARDIAC SCORING ([facility] LOCATIONS ONLY); Future     Follow-up: Return in about 6 months (around 09/19/2023).  Sanda Linger, MD

## 2023-03-19 NOTE — Telephone Encounter (Signed)
We have received surgical clearance pw from [facility] and it has been placed in the providers box.   Please fax to: [phone]

## 2023-03-19 NOTE — Patient Instructions (Signed)

## 2023-03-20 MED ORDER — VITAMIN B-1 50 MG PO TABS
50.0000 mg | ORAL_TABLET | Freq: Every day | ORAL | 1 refills | Status: DC
Start: 2023-03-20 — End: 2023-05-06

## 2023-03-24 MED ORDER — VERAPAMIL HCL ER 240 MG PO TBCR
240.0000 mg | EXTENDED_RELEASE_TABLET | Freq: Every day | ORAL | 1 refills | Status: DC
Start: 2023-03-24 — End: 2023-11-05

## 2023-03-24 MED ORDER — INDAPAMIDE 1.25 MG PO TABS
1.2500 mg | ORAL_TABLET | Freq: Every day | ORAL | 1 refills | Status: DC
Start: 2023-03-24 — End: 2023-12-18

## 2023-03-25 DIAGNOSIS — E7841 Elevated Lipoprotein(a): Secondary | ICD-10-CM | POA: Insufficient documentation

## 2023-03-25 DIAGNOSIS — R9431 Abnormal electrocardiogram [ECG] [EKG]: Secondary | ICD-10-CM | POA: Insufficient documentation

## 2023-03-25 LAB — LIPOPROTEIN A (LPA): Lipoprotein (a): 247 nmol/L — ABNORMAL HIGH

## 2023-03-25 NOTE — Telephone Encounter (Signed)
PCP has signed off on form stating that calcium scoring test would needed to be done first in case pt needs cardiac clearance  I have faxed form back.

## 2023-04-16 ENCOUNTER — Telehealth: Payer: Self-pay | Admitting: Internal Medicine

## 2023-04-16 NOTE — Telephone Encounter (Signed)
We have received Surgical Clearance pw from [facility] and it has been placed in the providers box.   Please fax to: [phone]

## 2023-04-17 ENCOUNTER — Other Ambulatory Visit: Payer: Medicare PPO

## 2023-04-17 ENCOUNTER — Other Ambulatory Visit: Payer: Self-pay | Admitting: Internal Medicine

## 2023-04-17 ENCOUNTER — Encounter: Payer: Self-pay | Admitting: Internal Medicine

## 2023-04-17 ENCOUNTER — Ambulatory Visit
Admission: RE | Admit: 2023-04-17 | Discharge: 2023-04-17 | Disposition: A | Discharge status: Home / Self Care | Payer: No Typology Code available for payment source | Source: Ambulatory Visit | Attending: Internal Medicine | Admitting: Internal Medicine

## 2023-04-17 DIAGNOSIS — Z720 Tobacco use: Secondary | ICD-10-CM

## 2023-04-17 DIAGNOSIS — E785 Hyperlipidemia, unspecified: Secondary | ICD-10-CM

## 2023-04-17 DIAGNOSIS — R9431 Abnormal electrocardiogram [ECG] [EKG]: Secondary | ICD-10-CM

## 2023-04-17 DIAGNOSIS — R931 Abnormal findings on diagnostic imaging of heart and coronary circulation: Secondary | ICD-10-CM | POA: Insufficient documentation

## 2023-04-20 NOTE — Telephone Encounter (Signed)
Form has been placed on PCPs desk to review and sign

## 2023-04-26 ENCOUNTER — Other Ambulatory Visit: Payer: Self-pay | Admitting: Internal Medicine

## 2023-04-26 DIAGNOSIS — E785 Hyperlipidemia, unspecified: Secondary | ICD-10-CM

## 2023-04-28 NOTE — Telephone Encounter (Signed)
Pt hs been informed via MyChart by PCP to seek cardiac clearance.

## 2023-04-29 ENCOUNTER — Other Ambulatory Visit: Payer: Self-pay | Admitting: Internal Medicine

## 2023-04-29 ENCOUNTER — Ambulatory Visit: Admission: RE | Admit: 2023-04-29 | Payer: Medicare PPO | Source: Ambulatory Visit

## 2023-04-29 DIAGNOSIS — Z1231 Encounter for screening mammogram for malignant neoplasm of breast: Secondary | ICD-10-CM

## 2023-05-06 ENCOUNTER — Encounter (HOSPITAL_BASED_OUTPATIENT_CLINIC_OR_DEPARTMENT_OTHER): Payer: Self-pay | Admitting: Nurse Practitioner

## 2023-05-06 ENCOUNTER — Ambulatory Visit (HOSPITAL_BASED_OUTPATIENT_CLINIC_OR_DEPARTMENT_OTHER): Payer: Medicare PPO | Admitting: Nurse Practitioner

## 2023-05-06 VITALS — BP 137/68 | HR 66 | Ht 63.0 in | Wt 197.0 lb

## 2023-05-06 DIAGNOSIS — Z720 Tobacco use: Secondary | ICD-10-CM

## 2023-05-06 DIAGNOSIS — Z7189 Other specified counseling: Secondary | ICD-10-CM

## 2023-05-06 DIAGNOSIS — R931 Abnormal findings on diagnostic imaging of heart and coronary circulation: Secondary | ICD-10-CM | POA: Diagnosis not present

## 2023-05-06 DIAGNOSIS — Z01810 Encounter for preprocedural cardiovascular examination: Secondary | ICD-10-CM

## 2023-05-06 DIAGNOSIS — I2510 Atherosclerotic heart disease of native coronary artery without angina pectoris: Secondary | ICD-10-CM | POA: Diagnosis not present

## 2023-05-06 DIAGNOSIS — E785 Hyperlipidemia, unspecified: Secondary | ICD-10-CM | POA: Diagnosis not present

## 2023-05-06 MED ORDER — REPATHA SURECLICK 140 MG/ML ~~LOC~~ SOAJ: 140.0000 mg | SUBCUTANEOUS | 11 refills | Status: DC

## 2023-05-06 NOTE — Patient Instructions (Signed)
Medication Instructions:   Your physician recommends that you continue on your current medications as directed. Please refer to the Current Medication list given to you today.   *If you need a refill on your cardiac medications before your next appointment, please call your pharmacy*   Lab Work:  None ordered.  If you have labs (blood work) drawn today and your tests are completely normal, you will receive your results only by: MyChart Message (if you have MyChart) OR A paper copy in the mail If you have any lab test that is abnormal or we need to change your treatment, we will call you to review the results.   Testing/Procedures:  None ordered.    Follow-Up: At [facility], you and your health needs are our priority.  As part of our continuing mission to provide you with exceptional heart care, we have created designated Provider Care Teams.  These Care Teams include your primary Cardiologist (physician) and Advanced Practice Providers (APPs -  Physician Assistants and Nurse Practitioners) who all work together to provide you with the care you need, when you need it.  We recommend signing up for the patient portal called "MyChart".  Sign up information is provided on this After Visit Summary.  MyChart is used to connect with patients for Virtual Visits (Telemedicine).  Patients are able to view lab/test results, encounter notes, upcoming appointments, etc.  Non-urgent messages can be sent to your provider as well.   To learn more about what you can do with MyChart, go to [email or web address].    Your next appointment:    To be determined on surgery.   Provider:   Eligha Bridegroom, NP    Other Instructions      Mediterranean Diet  Why follow it? Research shows. Those who follow the Mediterranean diet have a reduced risk of heart disease  The diet is associated with a reduced incidence of Parkinson's and Alzheimer's diseases People following the diet may have  longer life expectancies and lower rates of chronic diseases  The Dietary Guidelines for Americans recommends the Mediterranean diet as an eating plan to promote health and prevent disease  What Is the Mediterranean Diet?  Healthy eating plan based on typical foods and recipes of Mediterranean-style cooking The diet is primarily a plant based diet; these foods should make up a majority of meals   Starches - Plant based foods should make up a majority of meals - They are an important sources of vitamins, minerals, energy, antioxidants, and fiber - Choose whole grains, foods high in fiber and minimally processed items  - Typical grain sources include wheat, oats, barley, corn, brown rice, bulgar, farro, millet, polenta, couscous  - Various types of beans include chickpeas, lentils, fava beans, black beans, white beans   Fruits  Veggies - Large quantities of antioxidant rich fruits & veggies; 6 or more servings  - Vegetables can be eaten raw or lightly drizzled with oil and cooked  - Vegetables common to the traditional Mediterranean Diet include: artichokes, arugula, beets, broccoli, brussel sprouts, cabbage, carrots, celery, collard greens, cucumbers, eggplant, kale, leeks, lemons, lettuce, mushrooms, okra, onions, peas, peppers, potatoes, pumpkin, radishes, rutabaga, shallots, spinach, sweet potatoes, turnips, zucchini - Fruits common to the Mediterranean Diet include: apples, apricots, avocados, cherries, clementines, dates, figs, grapefruits, grapes, melons, nectarines, oranges, peaches, pears, pomegranates, strawberries, tangerines  Fats - Replace butter and margarine with healthy oils, such as olive oil, canola oil, and tahini  - Limit nuts to no more than  a handful a day  - Nuts include walnuts, almonds, pecans, pistachios, pine nuts  - Limit or avoid candied, honey roasted or heavily salted nuts - Olives are central to the Mediterranean diet - can be eaten whole or used in a variety of  dishes   Meats Protein - Limiting red meat: no more than a few times a month - When eating red meat: choose lean cuts and keep the portion to the size of deck of cards - Eggs: approx. 0 to 4 times a week  - Fish and lean poultry: at least 2 a week  - Healthy protein sources include, chicken, [place], lean beef, lamb - Increase intake of seafood such as tuna, salmon, trout, mackerel, shrimp, scallops - Avoid or limit high fat processed meats such as sausage and bacon  Dairy - Include moderate amounts of low fat dairy products  - Focus on healthy dairy such as fat free yogurt, skim milk, low or reduced fat cheese - Limit dairy products higher in fat such as whole or 2% milk, cheese, ice cream  Alcohol - Moderate amounts of red wine is ok  - No more than 5 oz daily for women (all ages) and men older than age 32  - No more than 10 oz of wine daily for men younger than 40  Other - Limit sweets and other desserts  - Use herbs and spices instead of salt to flavor foods  - Herbs and spices common to the traditional Mediterranean Diet include: basil, bay leaves, chives, cloves, cumin, fennel, garlic, lavender, marjoram, mint, oregano, parsley, pepper, rosemary, sage, savory, sumac, tarragon, thyme   It's not just a diet, it's a lifestyle:  The Mediterranean diet includes lifestyle factors typical of those in the region  Foods, drinks and meals are best eaten with others and savored Daily physical activity is important for overall good health This could be strenuous exercise like running and aerobics This could also be more leisurely activities such as walking, housework, yard-work, or taking the stairs Moderation is the key; a balanced and healthy diet accommodates most foods and drinks Consider portion sizes and frequency of consumption of certain foods   Meal Ideas & Options:  Breakfast:  Whole wheat toast or whole wheat English muffins with peanut butter & hard boiled egg Steel cut oats topped  with apples & cinnamon and skim milk  Fresh fruit: banana, strawberries, melon, berries, peaches  Smoothies: strawberries, bananas, greek yogurt, peanut butter Low fat greek yogurt with blueberries and granola  Egg white omelet with spinach and mushrooms Breakfast couscous: whole wheat couscous, apricots, skim milk, cranberries  Sandwiches:  Hummus and grilled vegetables (peppers, zucchini, squash) on whole wheat bread   Grilled chicken on whole wheat pita with lettuce, tomatoes, cucumbers or tzatziki  [place] salad on whole wheat bread: tuna salad made with greek yogurt, olives, red peppers, capers, green onions Garlic rosemary lamb pita: lamb sauted with garlic, rosemary, salt & pepper; add lettuce, cucumber, greek yogurt to pita - flavor with lemon juice and black pepper  Seafood:  Mediterranean grilled salmon, seasoned with garlic, basil, parsley, lemon juice and black pepper Shrimp, lemon, and spinach whole-grain pasta salad made with low fat greek yogurt  Seared scallops with lemon orzo  Seared tuna steaks seasoned salt, pepper, coriander topped with tomato mixture of olives, tomatoes, olive oil, minced garlic, parsley, green onions and cappers  Meats:  Herbed greek chicken salad with kalamata olives, cucumber, feta  Red bell peppers stuffed with  spinach, bulgur, lean ground beef (or lentils) & topped with feta   Kebabs: skewers of chicken, tomatoes, onions, zucchini, squash  [place] burgers: made with red onions, mint, dill, lemon juice, feta cheese topped with roasted red peppers Vegetarian Cucumber salad: cucumbers, artichoke hearts, celery, red onion, feta cheese, tossed in olive oil & lemon juice  Hummus and whole grain pita points with a greek salad (lettuce, tomato, feta, olives, cucumbers, red onion) Lentil soup with celery, carrots made with vegetable broth, garlic, salt and pepper  Tabouli salad: parsley, bulgur, mint, scallions, cucumbers, tomato, radishes, lemon juice, olive  oil, salt and pepper. Adopting a Healthy Lifestyle.   Weight: Know what a healthy weight is for you (roughly BMI <25) and aim to maintain this. You can calculate your body mass index on your smart phone. Unfortunately, this is not the most accurate measure of healthy weight, but it is the simplest measurement to use. A more accurate measurement involves body scanning which measures lean muscle, fat tissue and bony density. We do not have this equipment at [facility].    Diet: Aim for 7+ servings of fruits and vegetables daily Limit animal fats in diet for cholesterol and heart health - choose grass fed whenever available Avoid highly processed foods (fast food burgers, tacos, fried chicken, pizza, hot dogs, french fries)  Saturated fat comes in the form of butter, lard, coconut oil, margarine, partially hydrogenated oils, and fat in meat. These increase your risk of cardiovascular disease.  Use healthy plant oils, such as olive, canola, soy, corn, sunflower and peanut.  Whole foods such as fruits, vegetables and whole grains have fiber  Men need > 38 grams of fiber per day Women need > 25 grams of fiber per day  Load up on vegetables and fruits - one-half of your plate: Aim for color and variety, and remember that potatoes dont count. Go for whole grains - one-quarter of your plate: Whole wheat, barley, wheat berries, quinoa, oats, brown rice, and foods made with them. If you want pasta, go with whole wheat pasta. Protein power - one-quarter of your plate: Fish, chicken, beans, and nuts are all healthy, versatile protein sources. Limit red meat. You need carbohydrates for energy! The type of carbohydrate is more important than the amount. Choose carbohydrates such as vegetables, fruits, whole grains, beans, and nuts in the place of white rice, white pasta, potatoes (baked or fried), macaroni and cheese, cakes, cookies, and donuts.  If youre thirsty, drink water. Coffee and tea are good in  moderation, but skip sugary drinks and limit milk and dairy products to one or two daily servings. Keep sugar intake at 6 teaspoons or 24 grams or LESS       Exercise: Aim for 150 min of moderate intensity exercise weekly for heart health, and weights twice weekly for bone health Stay active - any steps are better than no steps! Aim for 7-9 hours of sleep daily

## 2023-05-06 NOTE — Progress Notes (Signed)
Cardiology Office Note:  .   Date:  05/06/2023 ID:  Holly Fowler, DOB [birth year], MRN [id number] PCP: Etta Grandchild, MD [facility] Providers Cardiologist:  None   Patient Profile: .      PMH: CT calcium score 405 (93rd percentile) Elevated Lipoprotein a = 247 Dyslipidemia Tobacco use disorder Hypertension Family history heart disease Sister CHF Father - pacemaker, deceased at age 66 Mother - deceased age 79s       History of Present Illness: .   Holly Fowler is a very pleasant loquacious 67 y.o. female  who is here today for new patient consult for elevated coronary calcium score. 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 Underwent CT for calcium score and was referred to [place]. She was scheduled for left hip replacement which has been canceled until she is seen by cardiology. Has been on atorvastatin for several years for elevated cholesterol. Activity is somewhat limited by hip pain. She does not participate in regular exercise, but reports being very active around her home and helping to care for her 37 y/o grandson. She is able to achieve > 4 METS activity without concerning cardiac symptoms. She does have occasional shortness of breath but unfortunately she continues to smoke. Is working on reducing number of cigarettes per day.  By her description, she seems more limited by hip pain then by shortness of breath.  No chest pain, palpitations, orthopnea, edema, PND, presyncope, syncope.  Family history: Her family history includes Hyperlipidemia in her brother, father, mother, and sister; Hypertension in her brother, father, mother, and sister.   ASCVD Risk Score: The 10-year ASCVD risk score (Arnett DK, et al., 2019) is: 16.5%   Values used to calculate the score:     Age: 25 years     Sex: Female     Is Non-Hispanic African American: Yes     Diabetic: No     Tobacco smoker: Yes     Systolic Blood Pressure: 137 mmHg     Is  BP treated: Yes     HDL Cholesterol: 48.1 mg/dL     Total Cholesterol: 139 mg/dL    Diet: Does not like vegetables, says they do not agree with her Tries to eat salads regularly but lettuce gives her reflux   Activity: Helps care for 39 y/o grandson Has left hip pain that limits activity    ROS: See HPI       Studies Reviewed: .           Risk Assessment/Calculations:         Physical Exam:   VS: BP 137/68   Pulse 66   Ht 5\' 3"  (1.6 m)   Wt 197 lb (89.4 kg)   BMI 34.90 kg/m   Wt Readings from Last 3 Encounters:  05/06/23 197 lb (89.4 kg)  03/19/23 194 lb (88 kg)  02/25/23 184 lb (83.5 kg)     GEN: Obese, well developed in no acute distress NECK: No JVD; No carotid bruits CARDIAC: RRR, no murmurs, rubs, gallops RESPIRATORY:  Clear to auscultation without rales, wheezing or rhonchi  ABDOMEN: Soft, non-tender, non-distended EXTREMITIES:  No edema; No deformity     ASSESSMENT AND PLAN: .    CAD: CT calcium score completed 04/17/23 with Agatston score of 405 (93rd percentile). Lengthy discussion about these findings and indications for further testing. She denies chest pain, dyspnea, or other symptoms concerning for angina.  No indication  for further ischemic evaluation at this time.   Emphasized the importance of secondary prevention including 150 minutes of moderate intensity exercise each week along with heart healthy diet avoiding saturated fat, processed foods, simple carbohydrates, and sugar. No bleeding concerns. Continue aspirin 81 mg daily for prevention. LDL is close to goal of < 70. As noted below, recommend PCSK9i for elevated Lp(a).   Preoperative cardiac evaluation: She is planning left hip arthroplasty with Dr. Charlann Boxer. According to the Revised Cardiac Risk Index (RCRI), her Perioperative Risk of Major Cardiac Event is (%): 0.9. Her Functional Capacity in METs is: 6.05 according to the Duke Activity Status Index (DASI).The patient is doing well from a cardiac  perspective. Therefore, based on ACC/AHA guidelines, the patient would be at acceptable risk for the planned procedure without further cardiovascular testing. I will forward my note to Dr. Nilsa Nutting office.   Dyslipidemia LDL goal < 70: LDL 73 on 03/19/23, elevated Lp(a). Lengthy discussion about initiating PCSK9 inhibitor due to elevated LP(a). She is agreeable as long as it is affordable. If therapy is initiated, we will recheck NMR in 2-3 months.   Tobacco abuse: Has reduced number of cigarettes per day and is working on further reduction. Complete cessation advised.   CV Risk Assessment: ASCVD risk score is 16.5%. We discussed the various factors of this including hypertension, smoking, age, and dyslipidemia. She additionally has the risk factor of elevated LP(a).  She is overall feeling well and is awaiting upcoming hip surgery.  Encouraged lifestyle modifications for secondary prevention. I will see her back soon for follow-up.   Plan/Goals: Increase physical activity after hip surgery with goal of walking for 150 minutes each week Nutrition information given - encouraged her to review for potential changes     Dispo: After hip surgery  Signed, Eligha Bridegroom, NP-C

## 2023-05-07 ENCOUNTER — Other Ambulatory Visit (HOSPITAL_COMMUNITY): Payer: Self-pay

## 2023-05-07 ENCOUNTER — Telehealth: Payer: Self-pay

## 2023-05-07 NOTE — Telephone Encounter (Signed)
Pharmacy Patient Advocate Encounter   Received notification from CoverMyMeds that prior authorization for REPATHA is required/requested.   Insurance verification completed.   The patient is insured through [place] .   Per test claim: PA required; PA submitted to [facility] via CoverMyMeds Key/confirmation #/EOC [id number] Status is pending

## 2023-05-07 NOTE — Telephone Encounter (Signed)
Pharmacy Patient Advocate Encounter  Received notification from [facility] that Prior Authorization for REPATHA has been APPROVED from 08/25/22 to 08/25/23. Ran test claim, Copay is $40. This test claim was processed through [facility]- copay amounts may vary at other pharmacies due to pharmacy/plan contracts, or as the patient moves through the different stages of their insurance plan.

## 2023-05-20 ENCOUNTER — Telehealth: Payer: Self-pay | Admitting: *Deleted

## 2023-05-20 ENCOUNTER — Telehealth: Payer: Self-pay | Admitting: Internal Medicine

## 2023-05-20 NOTE — Telephone Encounter (Signed)
We have received surgical clearance forms from [facility] and they have been placed in the providers box.   Please fax to: [phone]

## 2023-05-20 NOTE — Telephone Encounter (Signed)
Form placed on PCPs desk to review and sign

## 2023-05-20 NOTE — Telephone Encounter (Signed)
Clearance has been re-faxed to requesting office.

## 2023-05-20 NOTE — Telephone Encounter (Signed)
Pre-operative Risk Assessment    Patient Name: Holly Fowler  DOB: [birth year] MRN: [id number]    DATE OF LAST VISIT: 05/06/23 Eligha Bridegroom, NP DATE OF NEXT VISIT: NONE  Request for Surgical Clearance    Procedure:   LEFT TOTAL HIP ARTHROPLASTY  Date of Surgery:  Clearance TBD                                 Surgeon:  DR. MATTHEW OLIN Surgeon's Group or Practice Name:  [facility] Phone number:  [phone] ATTN: Rosalva Ferron Fax number:  [phone] AND [phone]   Type of Clearance Requested:   - Medical ; ASA   Type of Anesthesia:  Spinal   Additional requests/questions:    Elpidio Anis   05/20/2023, 2:34 PM

## 2023-05-20 NOTE — Telephone Encounter (Signed)
Preop Team,   Pt was seen in the clinic on 05/06/23. She received preop cardiac evaluation at that time and it was felt that she could proceed with surgery. Please resend OV not to the requesting office. Thank you.   Holly Fowler. Kordel Leavy NP-C     05/20/2023, 2:53 PM [facility] [address] Office [phone] Fax [phone]

## 2023-05-21 NOTE — Telephone Encounter (Signed)
Form has been signed and faxed back 

## 2023-05-27 ENCOUNTER — Telehealth: Payer: Self-pay | Admitting: Internal Medicine

## 2023-05-27 NOTE — Telephone Encounter (Signed)
Pt called stating Holly Fowler with [facility] needing office notes so she can proceed wit the pt hip replacement.  Fax [phone] Phone- [phone]

## 2023-05-27 NOTE — Telephone Encounter (Signed)
7/25 OV note has been faxed

## 2023-05-28 NOTE — Telephone Encounter (Signed)
Our office received a duplicate request. Pt was cleared by Eligha Bridegroom, NP 05/06/23. We re-faxed ov notes again on 05/20/23. I will re-fax notes again today 05/28/23.

## 2023-05-29 NOTE — Telephone Encounter (Signed)
7/25 OV note and labs have been printed and faxed again to number listed below.

## 2023-05-29 NOTE — Telephone Encounter (Signed)
Patient states she went to office and they state they have not received any notes yet, she asked if the office notes and lab work could be re faxed to them.

## 2023-06-09 DIAGNOSIS — Z79899 Other long term (current) drug therapy: Secondary | ICD-10-CM | POA: Diagnosis not present

## 2023-06-15 DIAGNOSIS — M25452 Effusion, left hip: Secondary | ICD-10-CM | POA: Diagnosis not present

## 2023-06-15 DIAGNOSIS — Z96641 Presence of right artificial hip joint: Secondary | ICD-10-CM | POA: Diagnosis not present

## 2023-06-15 DIAGNOSIS — M958 Other specified acquired deformities of musculoskeletal system: Secondary | ICD-10-CM | POA: Diagnosis not present

## 2023-06-15 DIAGNOSIS — M1612 Unilateral primary osteoarthritis, left hip: Secondary | ICD-10-CM | POA: Diagnosis not present

## 2023-06-15 HISTORY — PX: TOTAL HIP ARTHROPLASTY: SHX124

## 2023-07-25 ENCOUNTER — Other Ambulatory Visit: Payer: Self-pay | Admitting: Internal Medicine

## 2023-07-25 DIAGNOSIS — E785 Hyperlipidemia, unspecified: Secondary | ICD-10-CM

## 2023-07-31 DIAGNOSIS — Z96642 Presence of left artificial hip joint: Secondary | ICD-10-CM | POA: Diagnosis not present

## 2023-07-31 DIAGNOSIS — Z471 Aftercare following joint replacement surgery: Secondary | ICD-10-CM | POA: Diagnosis not present

## 2023-08-07 ENCOUNTER — Ambulatory Visit: Payer: Medicare PPO | Admitting: Family Medicine

## 2023-08-07 ENCOUNTER — Encounter: Payer: Self-pay | Admitting: Family Medicine

## 2023-08-07 VITALS — BP 142/78 | HR 80 | Temp 98.2°F | Ht 63.0 in | Wt 198.0 lb

## 2023-08-07 DIAGNOSIS — J029 Acute pharyngitis, unspecified: Secondary | ICD-10-CM

## 2023-08-07 DIAGNOSIS — R0981 Nasal congestion: Secondary | ICD-10-CM

## 2023-08-07 DIAGNOSIS — J069 Acute upper respiratory infection, unspecified: Secondary | ICD-10-CM | POA: Diagnosis not present

## 2023-08-07 DIAGNOSIS — R051 Acute cough: Secondary | ICD-10-CM

## 2023-08-07 LAB — POC COVID19 BINAXNOW: SARS Coronavirus 2 Ag: NEGATIVE

## 2023-08-07 NOTE — Patient Instructions (Addendum)
COVID test today was negative.  Follow-up with me for new or worsening symptoms

## 2023-08-07 NOTE — Progress Notes (Unsigned)
   Acute Office Visit  Subjective:     Patient ID: Holly Fowler, female    DOB: May 11, 1956, 67 y.o.   MRN: [id number]  Chief Complaint  Patient presents with   Cough    Cough, slight throat pain, notes of some sinus swelling, nasal congestion. Notes family members have had similar symptoms. PT has not tested for covid yet. Currently treating tylenol    HPI Patient is in today for evaluation of cough, ST, nasal congestion for the last 2 days. Has tried tylenol with little relief.  Has not attempted OTC cold meds. Has not taken COVID test for this illness. States that her husband, and grandson are also sick with the same symptoms. Denies abdominal pain, nausea, vomiting, diarrhea, rash, fever, chills, other symptoms.  Medical hx as outlined below.  ROS Per HPI      Objective:    BP (!) 142/78   Pulse 80   Temp 98.2 F (36.8 C) (Oral)   Ht 5\' 3"  (1.6 m)   Wt 198 lb (89.8 kg)   SpO2 98%   BMI 35.07 kg/m    Physical Exam Vitals and nursing note reviewed.  Constitutional:      General: She is not in acute distress. HENT:     Head: Normocephalic and atraumatic.     Nose: Congestion present.     Mouth/Throat:     Mouth: Mucous membranes are moist.     Pharynx: Oropharynx is clear. No oropharyngeal exudate or posterior oropharyngeal erythema.     Comments: Oropharyngeal cobblestoning  Eyes:     Extraocular Movements: Extraocular movements intact.  Cardiovascular:     Rate and Rhythm: Normal rate and regular rhythm.     Heart sounds: Normal heart sounds.  Pulmonary:     Effort: Pulmonary effort is normal. No respiratory distress.     Breath sounds: No wheezing, rhonchi or rales.  Musculoskeletal:     Cervical back: Normal range of motion and neck supple.  Lymphadenopathy:     Cervical: No cervical adenopathy.  Skin:    General: Skin is warm and dry.  Neurological:     Mental Status: She is alert.     Results for orders placed or performed in visit on  08/07/23  POC COVID-19  Result Value Ref Range   SARS Coronavirus 2 Ag Negative Negative        Assessment & Plan:  1. Viral URI (Primary) Continue supportive care at home.  COVID test was negative today. Follow-up with me for new or worsening symptoms. - POC COVID-19  2. Acute cough  - POC COVID-19  3. Sore throat  - POC COVID-19  4. Nasal congestion  - POC COVID-19     No orders of the defined types were placed in this encounter.   Return if symptoms worsen or fail to improve.  Moshe Cipro, FNP

## 2023-09-14 ENCOUNTER — Other Ambulatory Visit: Payer: Self-pay | Admitting: Internal Medicine

## 2023-09-14 ENCOUNTER — Ambulatory Visit (INDEPENDENT_AMBULATORY_CARE_PROVIDER_SITE_OTHER): Payer: Medicare PPO

## 2023-09-14 VITALS — Ht 63.0 in | Wt 198.0 lb

## 2023-09-14 DIAGNOSIS — Z780 Asymptomatic menopausal state: Secondary | ICD-10-CM

## 2023-09-14 DIAGNOSIS — Z0000 Encounter for general adult medical examination without abnormal findings: Secondary | ICD-10-CM

## 2023-09-14 DIAGNOSIS — Z1211 Encounter for screening for malignant neoplasm of colon: Secondary | ICD-10-CM | POA: Diagnosis not present

## 2023-09-14 DIAGNOSIS — Z1212 Encounter for screening for malignant neoplasm of rectum: Secondary | ICD-10-CM | POA: Diagnosis not present

## 2023-09-14 DIAGNOSIS — Z23 Encounter for immunization: Secondary | ICD-10-CM | POA: Insufficient documentation

## 2023-09-14 MED ORDER — BOOSTRIX 5-2.5-18.5 LF-MCG/0.5 IM SUSP: 0.5000 mL | Freq: Once | INTRAMUSCULAR | 0 refills | Status: AC

## 2023-09-14 NOTE — Patient Instructions (Signed)
[name] , Thank you for taking time to come for your Medicare Wellness Visit. I appreciate your ongoing commitment to your health goals. Please review the following plan we discussed and let me know if I can assist you in the future.   Referrals/Orders/Follow-Ups/Clinician Recommendations: I am sorry for your loss.  You are due for a Tetanus vaccine and a Flu vaccine.   You are also due for a colonoscopy and a Bone density screening.  Please call [facility] at [phone], to get scheduled for your colonoscopy.  You have an order for:   [x]   Bone Density     Please call for appointment:  [facility] [address] [place], [place] [address] [phone]    Make sure to wear two-piece clothing.  No lotions, powders, or deodorants the day of the appointment. Make sure to bring picture ID and insurance card.  Bring list of medications you are currently taking including any supplements.   Schedule your [place] screening mammogram through MyChart!   Log into your MyChart account.  Go to 'Visit' (or 'Appointments' if on mobile App) --> Schedule an Appointment  Under 'Select a Reason for Visit' choose the Mammogram Screening option.  Complete the pre-visit questions and select the time and place that best fits your schedule.    This is a list of the screening recommended for you and due dates:  Health Maintenance  Topic Date Due   Zoster (Shingles) Vaccine (1 of 2) Never done   Screening for Lung Cancer  Never done   COVID-19 Vaccine (3 - Moderna risk series) 03/07/2020   DEXA scan (bone density measurement)  Never done   DTaP/Tdap/Td vaccine (2 - Td or Tdap) 01/12/2023   Flu Shot  03/26/2023   Colon Cancer Screening  05/18/2024   Medicare Annual Wellness Visit  09/13/2024   Mammogram  04/28/2025   Pneumonia Vaccine (4 of 4 - PPSV23 or PCV20) 02/19/2026   Hepatitis C Screening  Completed   HPV Vaccine  Aged Out     Advanced directives: (Declined) Advance directive discussed with you today. Even though you declined this today, please call our office should you change your mind, and we can give you the proper paperwork for you to fill out.  Next Medicare Annual Wellness Visit scheduled for next year: Yes

## 2023-09-14 NOTE — Progress Notes (Signed)
Subjective:   Holly Fowler is a 68 y.o. female who presents for Medicare Annual (Subsequent) preventive examination.  Visit Complete: Virtual I connected with  Frances Furbish on 09/14/23 by a audio enabled telemedicine application and verified that I am speaking with the correct person using two identifiers.  Patient Location: Home  Provider Location: Office/Clinic  I discussed the limitations of evaluation and management by telemedicine. The patient expressed understanding and agreed to proceed.  Vital Signs: Because this visit was a virtual/telehealth visit, some criteria may be missing or patient reported. Any vitals not documented were not able to be obtained and vitals that have been documented are patient reported.   Cardiac Risk Factors include: advanced age (>12men, >60 women);hypertension;Other (see comment);dyslipidemia, Risk factor comments: CKD,     Objective:    Today's Vitals   09/14/23 0844  Weight: 198 lb (89.8 kg)  Height: 5\' 3"  (1.6 m)   Body mass index is 35.07 kg/m.     09/14/2023    8:56 AM 09/02/2022   11:07 AM  Advanced Directives  Does Patient Have a Medical Advance Directive? No No  Would patient like information on creating a medical advance directive?  No - Patient declined    Current Medications (verified) Outpatient Encounter Medications as of 09/14/2023  Medication Sig   aspirin EC 81 MG tablet Take 81 mg by mouth daily. Swallow whole.   atorvastatin (LIPITOR) 40 MG tablet Take 1 tablet by mouth once daily   Evolocumab (REPATHA SURECLICK) 140 MG/ML SOAJ Inject 140 mg into the skin every 14 (fourteen) days.   indapamide (LOZOL) 1.25 MG tablet Take 1 tablet (1.25 mg total) by mouth daily.   levocetirizine (XYZAL) 5 MG tablet Take 1 tablet (5 mg total) by mouth every evening.   simethicone (GAS-X) 80 MG chewable tablet Chew 1 tablet (80 mg total) by mouth every 6 (six) hours as needed for flatulence.   verapamil (CALAN-SR) 240 MG CR tablet Take  1 tablet (240 mg total) by mouth at bedtime.   No facility-administered encounter medications on file as of 09/14/2023.    Allergies (verified) Penicillins   History: Past Medical History:  Diagnosis Date   Hypertension    Past Surgical History:  Procedure Laterality Date   ADENOIDECTOMY  08/25/1969   TOTAL HIP ARTHROPLASTY Right    Family History  Problem Relation Age of Onset   Hypertension Mother    Hyperlipidemia Mother    Hypertension Father    Hyperlipidemia Father    Hypertension Sister    Hyperlipidemia Sister    Hypertension Brother    Hyperlipidemia Brother    Cancer Neg Hx    Diabetes Neg Hx    Stroke Neg Hx    Early death Neg Hx    Heart disease Neg Hx    Kidney disease Neg Hx    Social History   Socioeconomic History   Marital status: Widowed    Spouse name: Not on file   Number of children: Not on file   Years of education: 16   Highest education level: Not on file  Occupational History   Occupation: Retired    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 She lives with her husband and is caring for her father who has alzheimer's.      Mr. Corales passed this past Saturday-2025   Social Drivers of Health   Financial Resource Strain: Medium Risk (09/14/2023)   Overall Financial Resource Strain (CARDIA)    Difficulty of Paying Living Expenses: Somewhat hard  Food Insecurity: No Food Insecurity (09/14/2023)   Hunger Vital Sign    Worried About Running Out of Food in the Last Year: Never  true    Ran Out of Food in the Last Year: Never true  Transportation Needs: No Transportation Needs (09/14/2023)   PRAPARE - Administrator, Civil Service (Medical): No    Lack of Transportation (Non-Medical): No  Physical Activity: Inactive (09/14/2023)   Exercise Vital Sign    Days of Exercise per Week: 0 days    Minutes of Exercise per Session: 0 min  Stress: No Stress Concern Present (09/14/2023)   [employer] of Occupational Health - Occupational Stress Questionnaire    Feeling of Stress : Not at all  Social Connections: Moderately Integrated (09/14/2023)   Social Connection and Isolation Panel [NHANES]    Frequency of Communication with Friends and Family: More than three times a week    Frequency of Social Gatherings with Friends and Family: More than three times a week    Attends Religious Services: More than 4 times per year    Active Member of [employer] or Organizations: Yes    Attends Banker Meetings: Never    Marital Status: Widowed    Tobacco Counseling Ready to quit: Not Answered Counseling given: Not Answered   Clinical Intake:  Pre-visit preparation completed: Yes  Pain : No/denies pain     BMI - recorded: 35.07 Nutritional Status: BMI > 30  Obese Nutritional Risks: None  How often do you need to have someone help you when you read instructions, pamphlets, or other written materials from your doctor or pharmacy?: 1 - Never  Interpreter Needed?: No  Information entered by :: Michaela Broski, RMA   Activities of Daily Living    09/14/2023    8:44 AM  In your present state of health, do you have any difficulty performing the following activities:  Hearing? 0  Vision? 0  Difficulty concentrating or making decisions? 0  Walking or climbing stairs? 1  Dressing or bathing? 0  Doing errands, shopping? 0  Preparing Food and eating ? N  Using the Toilet? N  In the past six months, have you accidently leaked urine? N  Do you have  problems with loss of bowel control? N  Managing your Medications? N  Managing your Finances? N  Housekeeping or managing your Housekeeping? N    Patient Care Team: Etta Grandchild, MD as PCP - General (Internal Medicine)  Indicate any recent Medical Services you may have received from other than [facility] providers in the past year (date may be approximate).     Assessment:   This is a routine wellness examination for [place].  Hearing/Vision screen Hearing Screening - Comments:: Denies hearing difficulties   Vision Screening - Comments:: Wears eyeglasses   Goals Addressed  This Visit's Progress    My goal for 2024 is to maintain my health.   On track     Depression Screen    09/14/2023    8:59 AM 09/02/2022   11:11 AM 03/20/2022   10:34 AM 02/19/2021   10:53 AM 11/01/2019    8:48 AM 10/21/2018   10:08 AM 10/19/2017    8:56 AM  PHQ 2/9 Scores  PHQ - 2 Score 3 0 0 0 0 0 0  PHQ- 9 Score 3      0    Fall Risk    09/14/2023    8:58 AM 09/02/2022   11:08 AM 12/12/2021    1:28 PM 02/19/2021   10:53 AM 10/21/2018   10:08 AM  Fall Risk   Falls in the past year? 0 0 0  0  Number falls in past yr: 0 0  0 0  Injury with Fall? 0 0  0 0  Risk for fall due to : No Fall Risks No Fall Risks     Follow up Falls prevention discussed;Falls evaluation completed Falls prevention discussed   Falls evaluation completed    MEDICARE RISK AT HOME: Medicare Risk at Home Any stairs in or around the home?: Yes If so, are there any without handrails?: Yes Home free of loose throw rugs in walkways, pet beds, electrical cords, etc?: Yes Adequate lighting in your home to reduce risk of falls?: Yes Life alert?: No Use of a cane, walker or w/c?: Yes Grab bars in the bathroom?: Yes Shower chair or bench in shower?: No Elevated toilet seat or a handicapped toilet?: No  TIMED UP AND GO:  Was the test performed?  No    Cognitive Function:        09/14/2023    8:45 AM 09/02/2022   11:10  AM  6CIT Screen  What Year? 0 points 0 points  What month? 0 points 0 points  What time? 0 points 0 points  Count back from 20 0 points 0 points  Months in reverse 0 points 0 points  Repeat phrase 0 points 0 points  Total Score 0 points 0 points    Immunizations Immunization History  Administered Date(s) Administered   Fluad Quad(high Dose 65+) 09/11/2022   Influenza,inj,Quad PF,6+ Mos 07/15/2013, 08/29/2014, 08/21/2015, 10/20/2016, 08/13/2017, 04/20/2018, 05/22/2020   Influenza-Unspecified 08/26/2011   Moderna Sars-Covid-2 Vaccination 01/11/2020, 02/08/2020   Pneumococcal Conjugate-13 03/13/2016   Pneumococcal Polysaccharide-23 02/17/2014, 02/19/2021   Tdap 01/11/2013    TDAP status: Due, Education has been provided regarding the importance of this vaccine. Advised may receive this vaccine at local pharmacy or Health Dept. Aware to provide a copy of the vaccination record if obtained from local pharmacy or Health Dept. Verbalized acceptance and understanding.  Flu Vaccine status: Due, Education has been provided regarding the importance of this vaccine. Advised may receive this vaccine at local pharmacy or Health Dept. Aware to provide a copy of the vaccination record if obtained from local pharmacy or Health Dept. Verbalized acceptance and understanding.  Pneumococcal vaccine status: Up to date  Covid-19 vaccine status: Declined, Education has been provided regarding the importance of this vaccine but patient still declined. Advised may receive this vaccine at local pharmacy or Health Dept.or vaccine clinic. Aware to provide a copy of the vaccination record if obtained from local pharmacy or Health Dept. Verbalized acceptance and understanding.  Qualifies for Shingles Vaccine? Yes   Zostavax completed No   Shingrix Completed?: No.    Education  has been provided regarding the importance of this vaccine. Patient has been advised to call insurance company to determine out of pocket  expense if they have not yet received this vaccine. Advised may also receive vaccine at local pharmacy or Health Dept. Verbalized acceptance and understanding.  Screening Tests Health Maintenance  Topic Date Due   Zoster Vaccines- Shingrix (1 of 2) Never done   Lung Cancer Screening  Never done   COVID-19 Vaccine (3 - Moderna risk series) 03/07/2020   DEXA SCAN  Never done   DTaP/Tdap/Td (2 - Td or Tdap) 01/12/2023   INFLUENZA VACCINE  03/26/2023   Colonoscopy  05/18/2024   Medicare Annual Wellness (AWV)  09/13/2024   MAMMOGRAM  04/28/2025   Pneumonia Vaccine 55+ Years old (4 of 4 - PPSV23 or PCV20) 02/19/2026   Hepatitis C Screening  Completed   HPV VACCINES  Aged Out    Health Maintenance  Health Maintenance Due  Topic Date Due   Zoster Vaccines- Shingrix (1 of 2) Never done   Lung Cancer Screening  Never done   COVID-19 Vaccine (3 - Moderna risk series) 03/07/2020   DEXA SCAN  Never done   DTaP/Tdap/Td (2 - Td or Tdap) 01/12/2023   INFLUENZA VACCINE  03/26/2023    Colorectal cancer screening: Referral to GI placed 09/14/2023. Pt aware the office will call re: appt.  Mammogram status: Completed 04/29/2023. Repeat every year  Bone Density status: Ordered 09/14/2023. Pt provided with contact info and advised to call to schedule appt.  Lung Cancer Screening: (Low Dose CT Chest recommended if Age 33-80 years, 20 pack-year currently smoking OR have quit w/in 15years.) does qualify.   Lung Cancer Screening Referral: 03/19/2023  Additional Screening:  Hepatitis C Screening: does qualify; Completed 03/13/2016  Vision Screening: Recommended annual ophthalmology exams for early detection of glaucoma and other disorders of the eye. Is the patient up to date with their annual eye exam?  No  Who is the provider or what is the name of the office in which the patient attends annual eye exams? [facility]  If pt is not established with a provider, would they like to be referred to a  provider to establish care? No .   Dental Screening: Recommended annual dental exams for proper oral hygiene   Community Resource Referral / Chronic Care Management: CRR required this visit?  No   CCM required this visit?  No     Plan:     I have personally reviewed and noted the following in the patient's chart:   Medical and social history Use of alcohol, tobacco or illicit drugs  Current medications and supplements including opioid prescriptions. Patient is not currently taking opioid prescriptions. Functional ability and status Nutritional status Physical activity Advanced directives List of other physicians Hospitalizations, surgeries, and ER visits in previous 12 months Vitals Screenings to include cognitive, depression, and falls Referrals and appointments  In addition, I have reviewed and discussed with patient certain preventive protocols, quality metrics, and best practice recommendations. A written personalized care plan for preventive services as well as general preventive health recommendations were provided to patient.     Nadalyn Deringer L Jazen Spraggins, CMA   09/14/2023   After Visit Summary: (MyChart) Due to this being a telephonic visit, the after visit summary with patients personalized plan was offered to patient via MyChart   Nurse Notes: Patient's husband just passed on 10-04-2023.  She is due for a flu and Tdap vaccine and would like to get them  during her office visit in 3 days with Dr. Yetta Barre.  Patient is also due for a DEXA and a colonoscopy which orders has been placed today.  She had no other concerns to address today.

## 2023-09-17 ENCOUNTER — Encounter: Payer: Self-pay | Admitting: Internal Medicine

## 2023-09-17 ENCOUNTER — Ambulatory Visit: Payer: Medicare PPO | Admitting: Internal Medicine

## 2023-09-17 VITALS — BP 132/68 | HR 71 | Temp 97.7°F | Resp 16 | Ht 63.0 in | Wt 193.0 lb

## 2023-09-17 DIAGNOSIS — I10 Essential (primary) hypertension: Secondary | ICD-10-CM

## 2023-09-17 DIAGNOSIS — N1831 Chronic kidney disease, stage 3a: Secondary | ICD-10-CM | POA: Diagnosis not present

## 2023-09-17 DIAGNOSIS — D72829 Elevated white blood cell count, unspecified: Secondary | ICD-10-CM | POA: Diagnosis not present

## 2023-09-17 DIAGNOSIS — Z23 Encounter for immunization: Secondary | ICD-10-CM | POA: Diagnosis not present

## 2023-09-17 DIAGNOSIS — E785 Hyperlipidemia, unspecified: Secondary | ICD-10-CM | POA: Diagnosis not present

## 2023-09-17 LAB — URINALYSIS, ROUTINE W REFLEX MICROSCOPIC
Bilirubin Urine: NEGATIVE
Hgb urine dipstick: NEGATIVE
Ketones, ur: NEGATIVE
Nitrite: NEGATIVE
RBC / HPF: NONE SEEN (ref 0–?)
Specific Gravity, Urine: 1.015 (ref 1.000–1.030)
Total Protein, Urine: NEGATIVE
Urine Glucose: NEGATIVE
Urobilinogen, UA: 1 (ref 0.0–1.0)
pH: 6.5 (ref 5.0–8.0)

## 2023-09-17 LAB — CBC WITH DIFFERENTIAL/PLATELET
Basophils Absolute: 0.1 10*3/uL (ref 0.0–0.1)
Basophils Relative: 0.7 % (ref 0.0–3.0)
Eosinophils Absolute: 0.1 10*3/uL (ref 0.0–0.7)
Eosinophils Relative: 0.4 % (ref 0.0–5.0)
HCT: 39.9 % (ref 36.0–46.0)
Hemoglobin: 12.5 g/dL (ref 12.0–15.0)
Lymphocytes Relative: 14.9 % (ref 12.0–46.0)
Lymphs Abs: 2.4 10*3/uL (ref 0.7–4.0)
MCHC: 31.4 g/dL (ref 30.0–36.0)
MCV: 80.9 fL (ref 78.0–100.0)
Monocytes Absolute: 1.1 10*3/uL — ABNORMAL HIGH (ref 0.1–1.0)
Monocytes Relative: 7 % (ref 3.0–12.0)
Neutro Abs: 12.4 10*3/uL — ABNORMAL HIGH (ref 1.4–7.7)
Neutrophils Relative %: 77 % (ref 43.0–77.0)
Platelets: 362 10*3/uL (ref 150.0–400.0)
RBC: 4.92 Mil/uL (ref 3.87–5.11)
RDW: 16 % — ABNORMAL HIGH (ref 11.5–15.5)
WBC: 16.1 10*3/uL — ABNORMAL HIGH (ref 4.0–10.5)

## 2023-09-17 LAB — BASIC METABOLIC PANEL
BUN: 17 mg/dL (ref 6–23)
CO2: 28 meq/L (ref 19–32)
Calcium: 9.6 mg/dL (ref 8.4–10.5)
Chloride: 99 meq/L (ref 96–112)
Creatinine, Ser: 1.07 mg/dL (ref 0.40–1.20)
GFR: 53.77 mL/min — ABNORMAL LOW (ref >60.00)
Glucose, Bld: 119 mg/dL — ABNORMAL HIGH (ref 70–99)
Potassium: 4 meq/L (ref 3.5–5.1)
Sodium: 135 meq/L (ref 135–145)

## 2023-09-17 LAB — HEPATIC FUNCTION PANEL
ALT: 12 U/L (ref 0–35)
AST: 17 U/L (ref 0–37)
Albumin: 4.4 g/dL (ref 3.5–5.2)
Alkaline Phosphatase: 182 U/L — ABNORMAL HIGH (ref 39–117)
Bilirubin, Direct: 0.1 mg/dL (ref 0.0–0.3)
Total Bilirubin: 0.7 mg/dL (ref 0.2–1.2)
Total Protein: 7.7 g/dL (ref 6.0–8.3)

## 2023-09-17 MED ORDER — BOOSTRIX 5-2.5-18.5 LF-MCG/0.5 IM SUSP: 0.5000 mL | Freq: Once | INTRAMUSCULAR | 0 refills | Status: AC

## 2023-09-17 MED ORDER — SHINGRIX 50 MCG/0.5ML IM SUSR: 0.5000 mL | Freq: Once | INTRAMUSCULAR | 1 refills | Status: AC

## 2023-09-17 NOTE — Patient Instructions (Signed)
Hypertension, Adult High blood pressure (hypertension) is when the force of blood pumping through the arteries is too strong. The arteries are the blood vessels that carry blood from the heart throughout the body. Hypertension forces the heart to work harder to pump blood and may cause arteries to become narrow or stiff. Untreated or uncontrolled hypertension can lead to a heart attack, heart failure, a stroke, kidney disease, and other problems. A blood pressure reading consists of a higher number over a lower number. Ideally, your blood pressure should be below 120/80. The first ("top") number is called the systolic pressure. It is a measure of the pressure in your arteries as your heart beats. The second ("bottom") number is called the diastolic pressure. It is a measure of the pressure in your arteries as the heart relaxes. What are the causes? The exact cause of this condition is not known. There are some conditions that result in high blood pressure. What increases the risk? Certain factors may make you more likely to develop high blood pressure. Some of these risk factors are under your control, including: Smoking. Not getting enough exercise or physical activity. Being overweight. Having too much fat, sugar, calories, or salt (sodium) in your diet. Drinking too much alcohol. Other risk factors include: Having a personal history of heart disease, diabetes, high cholesterol, or kidney disease. Stress. Having a family history of high blood pressure and high cholesterol. Having obstructive sleep apnea. Age. The risk increases with age. What are the signs or symptoms? High blood pressure may not cause symptoms. Very high blood pressure (hypertensive crisis) may cause: Headache. Fast or irregular heartbeats (palpitations). Shortness of breath. Nosebleed. Nausea and vomiting. Vision changes. Severe chest pain, dizziness, and seizures. How is this diagnosed? This condition is diagnosed by  measuring your blood pressure while you are seated, with your arm resting on a flat surface, your legs uncrossed, and your feet flat on the floor. The cuff of the blood pressure monitor will be placed directly against the skin of your upper arm at the level of your heart. Blood pressure should be measured at least twice using the same arm. Certain conditions can cause a difference in blood pressure between your right and left arms. If you have a high blood pressure reading during one visit or you have normal blood pressure with other risk factors, you may be asked to: Return on a different day to have your blood pressure checked again. Monitor your blood pressure at home for 1 week or longer. If you are diagnosed with hypertension, you may have other blood or imaging tests to help your health care provider understand your overall risk for other conditions. How is this treated? This condition is treated by making healthy lifestyle changes, such as eating healthy foods, exercising more, and reducing your alcohol intake. You may be referred for counseling on a healthy diet and physical activity. Your health care provider may prescribe medicine if lifestyle changes are not enough to get your blood pressure under control and if: Your systolic blood pressure is above 130. Your diastolic blood pressure is above 80. Your personal target blood pressure may vary depending on your medical conditions, your age, and other factors. Follow these instructions at home: Eating and drinking  Eat a diet that is high in fiber and potassium, and low in sodium, added sugar, and fat. An example of this eating plan is called the DASH diet. DASH stands for Dietary Approaches to Stop Hypertension. To eat this way: Eat   plenty of fresh fruits and vegetables. Try to fill one half of your plate at each meal with fruits and vegetables. Eat whole grains, such as whole-wheat pasta, brown rice, or whole-grain bread. Fill about one  fourth of your plate with whole grains. Eat or drink low-fat dairy products, such as skim milk or low-fat yogurt. Avoid fatty cuts of meat, processed or cured meats, and poultry with skin. Fill about one fourth of your plate with lean proteins, such as fish, chicken without skin, beans, eggs, or tofu. Avoid pre-made and processed foods. These tend to be higher in sodium, added sugar, and fat. Reduce your daily sodium intake. Many people with hypertension should eat less than 1,500 mg of sodium a day. Do not drink alcohol if: Your health care provider tells you not to drink. You are pregnant, may be pregnant, or are planning to become pregnant. If you drink alcohol: Limit how much you have to: 0-1 drink a day for women. 0-2 drinks a day for men. Know how much alcohol is in your drink. In the [place] one drink equals one 12 oz bottle of beer (355 mL), one 5 oz glass of wine (148 mL), or one 1 oz glass of hard liquor (44 mL). Lifestyle  Work with your health care provider to maintain a healthy body weight or to lose weight. Ask what an ideal weight is for you. Get at least 30 minutes of exercise that causes your heart to beat faster (aerobic exercise) most days of the week. Activities may include walking, swimming, or biking. Include exercise to strengthen your muscles (resistance exercise), such as Pilates or lifting weights, as part of your weekly exercise routine. Try to do these types of exercises for 30 minutes at least 3 days a week. Do not use any products that contain nicotine or tobacco. These products include cigarettes, chewing tobacco, and vaping devices, such as e-cigarettes. If you need help quitting, ask your health care provider. Monitor your blood pressure at home as told by your health care provider. Keep all follow-up visits. This is important. Medicines Take over-the-counter and prescription medicines only as told by your health care provider. Follow directions carefully. Blood  pressure medicines must be taken as prescribed. Do not skip doses of blood pressure medicine. Doing this puts you at risk for problems and can make the medicine less effective. Ask your health care provider about side effects or reactions to medicines that you should watch for. Contact a health care provider if you: Think you are having a reaction to a medicine you are taking. Have headaches that keep coming back (recurring). Feel dizzy. Have swelling in your ankles. Have trouble with your vision. Get help right away if you: Develop a severe headache or confusion. Have unusual weakness or numbness. Feel faint. Have severe pain in your chest or abdomen. Vomit repeatedly. Have trouble breathing. These symptoms may be an emergency. Get help right away. Call 911. Do not wait to see if the symptoms will go away. Do not drive yourself to the hospital. Summary Hypertension is when the force of blood pumping through your arteries is too strong. If this condition is not controlled, it may put you at risk for serious complications. Your personal target blood pressure may vary depending on your medical conditions, your age, and other factors. For most people, a normal blood pressure is less than 120/80. Hypertension is treated with lifestyle changes, medicines, or a combination of both. Lifestyle changes include losing weight, eating a healthy,   low-sodium diet, exercising more, and limiting alcohol. This information is not intended to replace advice given to you by your health care provider. Make sure you discuss any questions you have with your health care provider. Document Revised: 06/18/2021 Document Reviewed: 06/18/2021 Elsevier Patient Education  2024 [employer]  

## 2023-09-17 NOTE — Progress Notes (Signed)
Subjective:  Patient ID: Holly Fowler, female    DOB: February 24, 1956  Age: 68 y.o. MRN: [id number]  CC: Hypertension   HPI Holly Fowler presents for f/up ---   Discussed the use of AI scribe software for clinical note transcription with the patient, who gave verbal consent to proceed.  History of Present Illness   The patient, with a history of cardiac disease and hip surgery, reports no recent cardiac symptoms such as chest pain or shortness of breath. She is under the care of a cardiologist and has been prescribed Repatha. The patient mentions a small piece of cement remaining from the hip surgery but otherwise feels the hip is healing well.  The patient has been maintaining some level of physical activity, primarily exercises for the hip, and denies experiencing any cardiac symptoms during these activities. She also denies any sensations of high or low blood pressure, but notes a current state of heightened stress due to personal circumstances.  The patient has been prescribed medication for pain, but it is unclear whether she is currently taking it. She reports difficulty catching her breath, which she attributes to sinus issues and cold weather. The patient's last EKG was performed prior to her hip surgery, and she was cleared for the procedure by her cardiologist.  The patient is a smoker, although she reports having cut down significantly. She is aware that smoking can raise her white blood cell count. The patient's white blood cell count is currently elevated, and she is due for a urine test to check kidney function and electrolytes.  The patient has not received her flu vaccine and is due for a tetanus shot and shingles vaccine.       Outpatient Medications Prior to Visit  Medication Sig Dispense Refill   aspirin EC 81 MG tablet Take 81 mg by mouth daily. Swallow whole.     atorvastatin (LIPITOR) 40 MG tablet Take 1 tablet by mouth once daily 90 tablet 0   Evolocumab (REPATHA  SURECLICK) 140 MG/ML SOAJ Inject 140 mg into the skin every 14 (fourteen) days. 2 mL 11   indapamide (LOZOL) 1.25 MG tablet Take 1 tablet (1.25 mg total) by mouth daily. 90 tablet 1   levocetirizine (XYZAL) 5 MG tablet Take 1 tablet (5 mg total) by mouth every evening. 90 tablet 3   simethicone (GAS-X) 80 MG chewable tablet Chew 1 tablet (80 mg total) by mouth every 6 (six) hours as needed for flatulence. 30 tablet 0   verapamil (CALAN-SR) 240 MG CR tablet Take 1 tablet (240 mg total) by mouth at bedtime. 90 tablet 1   No facility-administered medications prior to visit.    ROS Review of Systems  Constitutional: Negative.  Negative for appetite change, diaphoresis, fatigue and unexpected weight change.  HENT: Negative.    Eyes:  Negative for visual disturbance.  Respiratory: Negative.  Negative for cough, chest tightness, shortness of breath and wheezing.   Cardiovascular:  Negative for chest pain, palpitations and leg swelling.  Gastrointestinal:  Negative for abdominal pain, constipation, diarrhea, nausea and vomiting.  Genitourinary:  Negative for difficulty urinating and dysuria.  Musculoskeletal: Negative.  Negative for arthralgias.  Skin: Negative.  Negative for color change.  Neurological:  Negative for dizziness and weakness.  Hematological:  Negative for adenopathy. Does not bruise/bleed easily.  Psychiatric/Behavioral: Negative.      Objective:  BP 132/68 (BP Location: Left Arm, Patient Position: Sitting, Cuff Size: Normal)   Pulse 71   Temp 97.7  F (36.5 C) (Oral)   Resp 16   Ht 5\' 3"  (1.6 m)   Wt 193 lb (87.5 kg)   SpO2 97%   BMI 34.19 kg/m   BP Readings from Last 3 Encounters:  09/17/23 132/68  08/07/23 (!) 142/78  05/06/23 137/68    Wt Readings from Last 3 Encounters:  09/17/23 193 lb (87.5 kg)  09/14/23 198 lb (89.8 kg)  08/07/23 198 lb (89.8 kg)    Physical Exam Vitals reviewed.  Constitutional:      Appearance: Normal appearance.  HENT:      Mouth/Throat:     Mouth: Mucous membranes are moist.  Eyes:     General: No scleral icterus.    Conjunctiva/sclera: Conjunctivae normal.  Cardiovascular:     Rate and Rhythm: Normal rate and regular rhythm.     Heart sounds: No murmur heard.    No gallop.  Pulmonary:     Effort: Pulmonary effort is normal.     Breath sounds: No stridor. No wheezing, rhonchi or rales.  Abdominal:     General: Abdomen is flat.     Palpations: There is no mass.     Tenderness: There is no abdominal tenderness. There is no guarding.     Hernia: No hernia is present.  Musculoskeletal:        General: Normal range of motion.     Cervical back: Neck supple.     Right lower leg: No edema.     Left lower leg: No edema.  Lymphadenopathy:     Cervical: No cervical adenopathy.  Skin:    General: Skin is warm and dry.  Neurological:     General: No focal deficit present.     Mental Status: She is alert. Mental status is at baseline.  Psychiatric:        Mood and Affect: Mood normal.        Behavior: Behavior normal.     Lab Results  Component Value Date   WBC 16.1 (H) 09/17/2023   HGB 12.5 09/17/2023   HCT 39.9 09/17/2023   PLT 362.0 09/17/2023   GLUCOSE 119 (H) 09/17/2023   CHOL 139 03/19/2023   TRIG 88.0 03/19/2023   HDL 48.10 03/19/2023   LDLCALC 73 03/19/2023   ALT 12 09/17/2023   AST 17 09/17/2023   NA 135 09/17/2023   K 4.0 09/17/2023   CL 99 09/17/2023   CREATININE 1.07 09/17/2023   BUN 17 09/17/2023   CO2 28 09/17/2023   TSH 0.77 02/25/2023   HGBA1C 5.5 10/21/2018    MM 3D SCREENING MAMMOGRAM BILATERAL BREAST Result Date: 04/30/2023 CLINICAL DATA:  Screening. EXAM: DIGITAL SCREENING BILATERAL MAMMOGRAM WITH TOMOSYNTHESIS AND CAD TECHNIQUE: Bilateral screening digital craniocaudal and mediolateral oblique mammograms were obtained. Bilateral screening digital breast tomosynthesis was performed. The images were evaluated with computer-aided detection. COMPARISON:  Previous exam(s).  ACR Breast Density Category a: The breasts are almost entirely fatty. FINDINGS: There are no findings suspicious for malignancy. IMPRESSION: No mammographic evidence of malignancy. A result letter of this screening mammogram will be mailed directly to the patient. RECOMMENDATION: Screening mammogram in one year. (Code:SM-B-01Y) BI-RADS CATEGORY  1: Negative. Electronically Signed   By: Elberta Fortis M.D.   On: 04/30/2023 15:41    Assessment & Plan:  Need for immunization against influenza -     Flu Vaccine Trivalent High Dose (Fluad)  Stage 3a chronic kidney disease (HCC)- Will avoid nephrotoxic agents   Essential hypertension- Her BP is well controlled. -  Basic metabolic panel; Future -     CBC with Differential/Platelet; Future -     Urinalysis, Routine w reflex microscopic; Future  Leukocytosis, unspecified type- This is stable and c/w tobacco use. -     CBC with Differential/Platelet; Future -     Protein electrophoresis, serum; Future -     Hepatic function panel; Future  Need for prophylactic vaccination with combined diphtheria-tetanus-pertussis (DTP) vaccine -     Boostrix; Inject 0.5 mLs into the muscle once for 1 dose.  Dispense: 0.5 mL; Refill: 0  Need for prophylactic vaccination and inoculation against varicella -     Shingrix; Inject 0.5 mLs into the muscle once for 1 dose.  Dispense: 0.5 mL; Refill: 1  Hyperlipidemia with target LDL less than 100 - LDL goal achieved. Doing well on the statin  -     Hepatic function panel; Future     Follow-up: Return in about 6 months (around 03/16/2024).  Sanda Linger, MD

## 2023-09-19 LAB — PROTEIN ELECTROPHORESIS, SERUM
Albumin ELP: 3.8 g/dL (ref 3.8–4.8)
Alpha 1: 0.4 g/dL — ABNORMAL HIGH (ref 0.2–0.3)
Alpha 2: 0.9 g/dL (ref 0.5–0.9)
Beta 2: 0.5 g/dL (ref 0.2–0.5)
Beta Globulin: 0.5 g/dL (ref 0.4–0.6)
Gamma Globulin: 1 g/dL (ref 0.8–1.7)
Total Protein: 7.2 g/dL (ref 6.1–8.1)

## 2023-10-22 ENCOUNTER — Other Ambulatory Visit: Payer: Self-pay | Admitting: Internal Medicine

## 2023-10-22 DIAGNOSIS — E785 Hyperlipidemia, unspecified: Secondary | ICD-10-CM

## 2023-10-26 ENCOUNTER — Telehealth: Payer: Self-pay

## 2023-10-26 NOTE — Telephone Encounter (Signed)
 Copied from CRM [id number]. Topic: Clinical - Prescription Issue >> Oct 26, 2023  8:55 AM Martinique E wrote: Reason for CRM: Patient is out of refills for her atorvastatin (LIPITOR) 40 MG tablet and would like Dr. Yetta Barre to prescribe more refills.

## 2023-10-27 NOTE — Telephone Encounter (Signed)
Medication refill was sent in °

## 2023-11-05 ENCOUNTER — Other Ambulatory Visit: Payer: Self-pay | Admitting: Internal Medicine

## 2023-11-05 DIAGNOSIS — I10 Essential (primary) hypertension: Secondary | ICD-10-CM

## 2023-11-10 ENCOUNTER — Encounter: Payer: Self-pay | Admitting: Internal Medicine

## 2023-11-21 ENCOUNTER — Other Ambulatory Visit: Payer: Self-pay | Admitting: Internal Medicine

## 2023-11-21 DIAGNOSIS — J301 Allergic rhinitis due to pollen: Secondary | ICD-10-CM

## 2023-12-17 ENCOUNTER — Other Ambulatory Visit: Payer: Self-pay | Admitting: Internal Medicine

## 2023-12-17 DIAGNOSIS — I10 Essential (primary) hypertension: Secondary | ICD-10-CM

## 2023-12-18 ENCOUNTER — Other Ambulatory Visit: Payer: Self-pay | Admitting: Internal Medicine

## 2023-12-18 DIAGNOSIS — I10 Essential (primary) hypertension: Secondary | ICD-10-CM

## 2023-12-18 NOTE — Telephone Encounter (Signed)
 Copied from CRM [id number]. Topic: Clinical - Medication Refill >> Dec 18, 2023  4:27 PM Bambi Bonine D wrote: Most Recent Primary Care Visit:  Provider: Arcadio Knuckles  Department: [facility]  Visit Type: OFFICE VISIT  Date: 09/17/2023  Medication: indapamide  (LOZOL ) 1.25 MG tablet  Has the patient contacted their pharmacy? Yes (Agent: If no, request that the patient contact the pharmacy for the refill. If patient does not wish to contact the pharmacy document the reason why and proceed with request.) (Agent: If yes, when and what did the pharmacy advise?)  Is this the correct pharmacy for this prescription? Yes If no, delete pharmacy and type the correct one.  This is the patient's preferred pharmacy:  [facility] [address] [place], [place] - [address] [place] [address] Phone: [phone] Fax: [phone]   Has the prescription been filled recently? No  Is the patient out of the medication? Yes  Has the patient been seen for an appointment in the last year OR does the patient have an upcoming appointment? Yes  Can we respond through MyChart? Yes  Agent: Please be advised that Rx refills may take up to 3 business days. We ask that you follow-up with your pharmacy.

## 2023-12-21 MED ORDER — INDAPAMIDE 1.25 MG PO TABS: 1.2500 mg | ORAL_TABLET | Freq: Every day | ORAL | 1 refills | Status: DC

## 2024-01-25 ENCOUNTER — Other Ambulatory Visit: Payer: Self-pay | Admitting: Internal Medicine

## 2024-01-25 DIAGNOSIS — E785 Hyperlipidemia, unspecified: Secondary | ICD-10-CM

## 2024-01-26 ENCOUNTER — Telehealth: Payer: Self-pay | Admitting: Internal Medicine

## 2024-01-26 NOTE — Telephone Encounter (Signed)
 Copied from CRM [id number]. Topic: General - Other >> Jan 26, 2024  9:08 AM Jenice Mitts wrote: Reason for CRM: Patient is calling to see the status of her Atorvastatin  Calcium 

## 2024-01-27 ENCOUNTER — Other Ambulatory Visit: Payer: Self-pay

## 2024-01-27 DIAGNOSIS — E785 Hyperlipidemia, unspecified: Secondary | ICD-10-CM

## 2024-01-27 MED ORDER — ATORVASTATIN CALCIUM 40 MG PO TABS: 40.0000 mg | ORAL_TABLET | Freq: Every day | ORAL | 1 refills | Status: DC

## 2024-01-27 NOTE — Telephone Encounter (Signed)
 Medication has been refilled and patient has been made aware.

## 2024-02-02 ENCOUNTER — Other Ambulatory Visit: Payer: Self-pay | Admitting: Internal Medicine

## 2024-02-02 DIAGNOSIS — I10 Essential (primary) hypertension: Secondary | ICD-10-CM

## 2024-03-16 ENCOUNTER — Ambulatory Visit: Payer: Self-pay | Admitting: Internal Medicine

## 2024-03-16 ENCOUNTER — Encounter: Payer: Self-pay | Admitting: Internal Medicine

## 2024-03-16 ENCOUNTER — Ambulatory Visit: Payer: Medicare PPO | Admitting: Internal Medicine

## 2024-03-16 VITALS — BP 138/84 | HR 68 | Temp 98.0°F | Ht 63.0 in | Wt 201.0 lb

## 2024-03-16 DIAGNOSIS — D72829 Elevated white blood cell count, unspecified: Secondary | ICD-10-CM | POA: Diagnosis not present

## 2024-03-16 DIAGNOSIS — J301 Allergic rhinitis due to pollen: Secondary | ICD-10-CM

## 2024-03-16 DIAGNOSIS — H6121 Impacted cerumen, right ear: Secondary | ICD-10-CM

## 2024-03-16 DIAGNOSIS — F17200 Nicotine dependence, unspecified, uncomplicated: Secondary | ICD-10-CM

## 2024-03-16 DIAGNOSIS — E785 Hyperlipidemia, unspecified: Secondary | ICD-10-CM

## 2024-03-16 DIAGNOSIS — Z23 Encounter for immunization: Secondary | ICD-10-CM | POA: Diagnosis not present

## 2024-03-16 DIAGNOSIS — N1831 Chronic kidney disease, stage 3a: Secondary | ICD-10-CM | POA: Diagnosis not present

## 2024-03-16 DIAGNOSIS — I10 Essential (primary) hypertension: Secondary | ICD-10-CM

## 2024-03-16 DIAGNOSIS — Z1211 Encounter for screening for malignant neoplasm of colon: Secondary | ICD-10-CM | POA: Insufficient documentation

## 2024-03-16 LAB — LIPID PANEL
Cholesterol: 91 mg/dL (ref 0–200)
HDL: 44 mg/dL (ref >39.00)
LDL Cholesterol: 31 mg/dL (ref 0–99)
NonHDL: 47.24
Total CHOL/HDL Ratio: 2
Triglycerides: 79 mg/dL (ref 0.0–149.0)
VLDL: 15.8 mg/dL (ref 0.0–40.0)

## 2024-03-16 LAB — BASIC METABOLIC PANEL WITH GFR
BUN: 16 mg/dL (ref 6–23)
CO2: 27 meq/L (ref 19–32)
Calcium: 9.6 mg/dL (ref 8.4–10.5)
Chloride: 101 meq/L (ref 96–112)
Creatinine, Ser: 1.07 mg/dL (ref 0.40–1.20)
GFR: 53.58 mL/min — ABNORMAL LOW (ref >60.00)
Glucose, Bld: 105 mg/dL — ABNORMAL HIGH (ref 70–99)
Potassium: 4 meq/L (ref 3.5–5.1)
Sodium: 137 meq/L (ref 135–145)

## 2024-03-16 LAB — CBC WITH DIFFERENTIAL/PLATELET
Basophils Absolute: 0 K/uL (ref 0.0–0.1)
Basophils Relative: 0.4 % (ref 0.0–3.0)
Eosinophils Absolute: 0.1 K/uL (ref 0.0–0.7)
Eosinophils Relative: 0.8 % (ref 0.0–5.0)
HCT: 38.8 % (ref 36.0–46.0)
Hemoglobin: 12.3 g/dL (ref 12.0–15.0)
Lymphocytes Relative: 25.1 % (ref 12.0–46.0)
Lymphs Abs: 2.6 K/uL (ref 0.7–4.0)
MCHC: 31.6 g/dL (ref 30.0–36.0)
MCV: 81.1 fl (ref 78.0–100.0)
Monocytes Absolute: 0.7 K/uL (ref 0.1–1.0)
Monocytes Relative: 6.6 % (ref 3.0–12.0)
Neutro Abs: 7 K/uL (ref 1.4–7.7)
Neutrophils Relative %: 67.1 % (ref 43.0–77.0)
Platelets: 323 K/uL (ref 150.0–400.0)
RBC: 4.79 Mil/uL (ref 3.87–5.11)
RDW: 14.2 % (ref 11.5–15.5)
WBC: 10.4 K/uL (ref 4.0–10.5)

## 2024-03-16 LAB — URINALYSIS, ROUTINE W REFLEX MICROSCOPIC
Bilirubin Urine: NEGATIVE
Hgb urine dipstick: NEGATIVE
Ketones, ur: NEGATIVE
Leukocytes,Ua: NEGATIVE
Nitrite: NEGATIVE
RBC / HPF: NONE SEEN (ref 0–?)
Specific Gravity, Urine: 1.01 (ref 1.000–1.030)
Total Protein, Urine: NEGATIVE
Urine Glucose: NEGATIVE
Urobilinogen, UA: 0.2 (ref 0.0–1.0)
WBC, UA: NONE SEEN (ref 0–?)
pH: 6.5 (ref 5.0–8.0)

## 2024-03-16 LAB — TSH: TSH: 1.04 u[IU]/mL (ref 0.35–5.50)

## 2024-03-16 MED ORDER — EMPAGLIFLOZIN 10 MG PO TABS: 10.0000 mg | ORAL_TABLET | Freq: Every day | ORAL | 1 refills | Status: DC

## 2024-03-16 MED ORDER — BOOSTRIX 5-2.5-18.5 LF-MCG/0.5 IM SUSP: 0.5000 mL | Freq: Once | INTRAMUSCULAR | 0 refills | Status: AC

## 2024-03-16 MED ORDER — AZELASTINE-FLUTICASONE 137-50 MCG/ACT NA SUSP: 1.0000 | Freq: Two times a day (BID) | NASAL | 1 refills | Status: AC

## 2024-03-16 MED ORDER — LEVOCETIRIZINE DIHYDROCHLORIDE 5 MG PO TABS: 5.0000 mg | ORAL_TABLET | Freq: Every evening | ORAL | 1 refills | Status: AC

## 2024-03-16 NOTE — Progress Notes (Signed)
 Subjective:  Patient ID: Holly Fowler, female    DOB: 1955-09-09  Age: 68 y.o. MRN: [id number]  CC: Hypertension and Allergic Rhinitis    HPI Holly Fowler presents for f/up ----  Discussed the use of AI scribe software for clinical note transcription with the patient, who gave verbal consent to proceed.  History of Present Illness Holly Fowler is a 69 year old female who presents with nasal congestion and ear blockage.  She experiences nasal congestion and ear blockage, which she attributes to changes in weather and temperature, especially when traveling. These symptoms typically occur when she goes out of town and experiences fluctuations between heat and air conditioning. No chest pain, shortness of breath, dizziness, lightheadedness, coughing, fever, chills, muscle or joint aches. She spits up clear mucus but no blood.  She has been using Xyzal , prescribed in March, but is unsure of its effectiveness. She previously received samples and did not have to purchase it. She inquires about restarting the medication.  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 She notes some issues with her ankles.  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 Several family members and friends have recently passed away.    Outpatient Medications Prior to Visit  Medication Sig Dispense Refill   aspirin EC 81 MG tablet Take 81 mg by mouth daily. Swallow whole.     atorvastatin  (LIPITOR) 40 MG tablet Take 1 tablet (40 mg total) by mouth daily. 90 tablet 1   Evolocumab  (REPATHA  SURECLICK) 140 MG/ML SOAJ Inject 140 mg into the skin every 14 (fourteen) days. 2 mL 11   indapamide  (LOZOL ) 1.25 MG tablet Take 1 tablet (1.25 mg total) by mouth daily. 90 tablet 1   simethicone  (GAS-X) 80 MG chewable tablet Chew 1 tablet (80 mg total) by mouth every 6 (six) hours as needed for flatulence. 30 tablet 0   verapamil  (CALAN -SR) 240  MG CR tablet TAKE 1 TABLET BY MOUTH AT BEDTIME 90 tablet 0   levocetirizine (XYZAL ) 5 MG tablet TAKE 1 TABLET BY MOUTH ONCE DAILY IN THE EVENING 90 tablet 0   No facility-administered medications prior to visit.    ROS Review of Systems  Constitutional:  Negative for appetite change, chills, diaphoresis, fatigue and fever.  HENT:  Positive for hearing loss, postnasal drip and rhinorrhea. Negative for facial swelling, sinus pressure, sore throat and trouble swallowing.   Eyes: Negative.   Respiratory: Negative.    Genitourinary: Negative.   Musculoskeletal: Negative.   Skin: Negative.   Neurological:  Negative for dizziness and weakness.  Hematological:  Negative for adenopathy. Does not bruise/bleed easily.  Psychiatric/Behavioral: Negative.      Objective:  BP 138/84 (BP Location: Left Arm, Patient Position: Sitting, Cuff Size: Normal)   Pulse 68   Temp 98 F (36.7 C) (Oral)   Ht 5' 3 (1.6 m)   Wt 201 lb (91.2 kg)   SpO2 98%   BMI 35.61 kg/m   BP Readings from Last 3 Encounters:  03/16/24 138/84  09/17/23 132/68  08/07/23 (!) 142/78    Wt Readings from Last 3 Encounters:  03/16/24 201 lb (91.2 kg)  09/17/23 193 lb (87.5 kg)  09/14/23 198 lb (89.8 kg)    Physical Exam HENT:     Right Ear: Decreased hearing noted. There is impacted cerumen.     Left Ear: Decreased hearing noted. There is  no impacted cerumen.  Cardiovascular:     Rate and Rhythm: Normal rate and regular rhythm.     Comments: EKG-- NSR with SA, 63 bpm LAE RAD No LVH or Q waves Unchanged  Musculoskeletal:     Right lower leg: No edema.     Left lower leg: No edema.     Lab Results  Component Value Date   WBC 10.4 03/16/2024   HGB 12.3 03/16/2024   HCT 38.8 03/16/2024   PLT 323.0 03/16/2024   GLUCOSE 105 (H) 03/16/2024   CHOL 91 03/16/2024   TRIG 79.0 03/16/2024   HDL 44.00 03/16/2024   LDLCALC 31 03/16/2024   ALT 12 09/17/2023   AST 17 09/17/2023   NA 137 03/16/2024   K 4.0  03/16/2024   CL 101 03/16/2024   CREATININE 1.07 03/16/2024   BUN 16 03/16/2024   CO2 27 03/16/2024   TSH 1.04 03/16/2024   HGBA1C 5.5 10/21/2018    MM 3D SCREENING MAMMOGRAM BILATERAL BREAST Result Date: 04/30/2023 CLINICAL DATA:  Screening. EXAM: DIGITAL SCREENING BILATERAL MAMMOGRAM WITH TOMOSYNTHESIS AND CAD TECHNIQUE: Bilateral screening digital craniocaudal and mediolateral oblique mammograms were obtained. Bilateral screening digital breast tomosynthesis was performed. The images were evaluated with computer-aided detection. COMPARISON:  Previous exam(s). ACR Breast Density Category a: The breasts are almost entirely fatty. FINDINGS: There are no findings suspicious for malignancy. IMPRESSION: No mammographic evidence of malignancy. A result letter of this screening mammogram will be mailed directly to the patient. RECOMMENDATION: Screening mammogram in one year. (Code:SM-B-01Y) BI-RADS CATEGORY  1: Negative. Electronically Signed   By: Toribio Agreste M.D.   On: 04/30/2023 15:41    Assessment & Plan:  Leukocytosis, unspecified type -     CBC with Differential/Platelet; Future  Essential hypertension -     Basic metabolic panel with GFR; Future -     TSH; Future -     EKG 12-Lead -     Urinalysis, Routine w reflex microscopic; Future  Hyperlipidemia with target LDL less than 100 -     Lipid panel; Future -     TSH; Future  Smoker unmotivated to quit -     Ambulatory Referral for Lung Cancer Scre  Need for prophylactic vaccination with combined diphtheria-tetanus-pertussis (DTP) vaccine -     Boostrix ; Inject 0.5 mLs into the muscle once for 1 dose.  Dispense: 0.5 mL; Refill: 0  Non-seasonal allergic rhinitis due to pollen -     Levocetirizine Dihydrochloride ; Take 1 tablet (5 mg total) by mouth every evening.  Dispense: 90 tablet; Refill: 1 -     Azelastine -Fluticasone ; Place 1 spray into the nose every 12 (twelve) hours.  Dispense: 69 g; Refill: 1  Hearing loss of right ear  due to cerumen impaction  Stage 3a chronic kidney disease (HCC) -     Empagliflozin ; Take 1 tablet (10 mg total) by mouth daily before breakfast.  Dispense: 90 tablet; Refill: 1     Follow-up: Return in about 6 months (around 09/16/2024).  Debby Molt, MD

## 2024-03-16 NOTE — Patient Instructions (Signed)
 Hypertension, Adult High blood pressure (hypertension) is when the force of blood pumping through the arteries is too strong. The arteries are the blood vessels that carry blood from the heart throughout the body. Hypertension forces the heart to work harder to pump blood and may cause arteries to become narrow or stiff. Untreated or uncontrolled hypertension can lead to a heart attack, heart failure, a stroke, kidney disease, and other problems. A blood pressure reading consists of a higher number over a lower number. Ideally, your blood pressure should be below 120/80. The first ("top") number is called the systolic pressure. It is a measure of the pressure in your arteries as your heart beats. The second ("bottom") number is called the diastolic pressure. It is a measure of the pressure in your arteries as the heart relaxes. What are the causes? The exact cause of this condition is not known. There are some conditions that result in high blood pressure. What increases the risk? Certain factors may make you more likely to develop high blood pressure. Some of these risk factors are under your control, including: Smoking. Not getting enough exercise or physical activity. Being overweight. Having too much fat, sugar, calories, or salt (sodium) in your diet. Drinking too much alcohol. Other risk factors include: Having a personal history of heart disease, diabetes, high cholesterol, or kidney disease. Stress. Having a family history of high blood pressure and high cholesterol. Having obstructive sleep apnea. Age. The risk increases with age. What are the signs or symptoms? High blood pressure may not cause symptoms. Very high blood pressure (hypertensive crisis) may cause: Headache. Fast or irregular heartbeats (palpitations). Shortness of breath. Nosebleed. Nausea and vomiting. Vision changes. Severe chest pain, dizziness, and seizures. How is this diagnosed? This condition is diagnosed by  measuring your blood pressure while you are seated, with your arm resting on a flat surface, your legs uncrossed, and your feet flat on the floor. The cuff of the blood pressure monitor will be placed directly against the skin of your upper arm at the level of your heart. Blood pressure should be measured at least twice using the same arm. Certain conditions can cause a difference in blood pressure between your right and left arms. If you have a high blood pressure reading during one visit or you have normal blood pressure with other risk factors, you may be asked to: Return on a different day to have your blood pressure checked again. Monitor your blood pressure at home for 1 week or longer. If you are diagnosed with hypertension, you may have other blood or imaging tests to help your health care provider understand your overall risk for other conditions. How is this treated? This condition is treated by making healthy lifestyle changes, such as eating healthy foods, exercising more, and reducing your alcohol intake. You may be referred for counseling on a healthy diet and physical activity. Your health care provider may prescribe medicine if lifestyle changes are not enough to get your blood pressure under control and if: Your systolic blood pressure is above 130. Your diastolic blood pressure is above 80. Your personal target blood pressure may vary depending on your medical conditions, your age, and other factors. Follow these instructions at home: Eating and drinking  Eat a diet that is high in fiber and potassium, and low in sodium, added sugar, and fat. An example of this eating plan is called the DASH diet. DASH stands for Dietary Approaches to Stop Hypertension. To eat this way: Eat  plenty of fresh fruits and vegetables. Try to fill one half of your plate at each meal with fruits and vegetables. Eat whole grains, such as whole-wheat pasta, brown rice, or whole-grain bread. Fill about one  fourth of your plate with whole grains. Eat or drink low-fat dairy products, such as skim milk or low-fat yogurt. Avoid fatty cuts of meat, processed or cured meats, and poultry with skin. Fill about one fourth of your plate with lean proteins, such as fish, chicken without skin, beans, eggs, or tofu. Avoid pre-made and processed foods. These tend to be higher in sodium, added sugar, and fat. Reduce your daily sodium intake. Many people with hypertension should eat less than 1,500 mg of sodium a day. Do not drink alcohol if: Your health care provider tells you not to drink. You are pregnant, may be pregnant, or are planning to become pregnant. If you drink alcohol: Limit how much you have to: 0-1 drink a day for women. 0-2 drinks a day for men. Know how much alcohol is in your drink. In the [place] one drink equals one 12 oz bottle of beer (355 mL), one 5 oz glass of wine (148 mL), or one 1 oz glass of hard liquor (44 mL). Lifestyle  Work with your health care provider to maintain a healthy body weight or to lose weight. Ask what an ideal weight is for you. Get at least 30 minutes of exercise that causes your heart to beat faster (aerobic exercise) most days of the week. Activities may include walking, swimming, or biking. Include exercise to strengthen your muscles (resistance exercise), such as Pilates or lifting weights, as part of your weekly exercise routine. Try to do these types of exercises for 30 minutes at least 3 days a week. Do not use any products that contain nicotine or tobacco. These products include cigarettes, chewing tobacco, and vaping devices, such as e-cigarettes. If you need help quitting, ask your health care provider. Monitor your blood pressure at home as told by your health care provider. Keep all follow-up visits. This is important. Medicines Take over-the-counter and prescription medicines only as told by your health care provider. Follow directions carefully. Blood  pressure medicines must be taken as prescribed. Do not skip doses of blood pressure medicine. Doing this puts you at risk for problems and can make the medicine less effective. Ask your health care provider about side effects or reactions to medicines that you should watch for. Contact a health care provider if you: Think you are having a reaction to a medicine you are taking. Have headaches that keep coming back (recurring). Feel dizzy. Have swelling in your ankles. Have trouble with your vision. Get help right away if you: Develop a severe headache or confusion. Have unusual weakness or numbness. Feel faint. Have severe pain in your chest or abdomen. Vomit repeatedly. Have trouble breathing. These symptoms may be an emergency. Get help right away. Call 911. Do not wait to see if the symptoms will go away. Do not drive yourself to the hospital. Summary Hypertension is when the force of blood pumping through your arteries is too strong. If this condition is not controlled, it may put you at risk for serious complications. Your personal target blood pressure may vary depending on your medical conditions, your age, and other factors. For most people, a normal blood pressure is less than 120/80. Hypertension is treated with lifestyle changes, medicines, or a combination of both. Lifestyle changes include losing weight, eating a healthy,  low-sodium diet, exercising more, and limiting alcohol. This information is not intended to replace advice given to you by your health care provider. Make sure you discuss any questions you have with your health care provider. Document Revised: 06/18/2021 Document Reviewed: 06/18/2021 Elsevier Patient Education  2024 [employer].

## 2024-05-03 ENCOUNTER — Other Ambulatory Visit: Payer: Self-pay | Admitting: Internal Medicine

## 2024-05-03 DIAGNOSIS — I10 Essential (primary) hypertension: Secondary | ICD-10-CM

## 2024-05-04 ENCOUNTER — Telehealth: Payer: Self-pay | Admitting: Nurse Practitioner

## 2024-05-04 ENCOUNTER — Other Ambulatory Visit: Payer: Self-pay

## 2024-05-04 MED ORDER — REPATHA SURECLICK 140 MG/ML ~~LOC~~ SOAJ: 140.0000 mg | SUBCUTANEOUS | 11 refills | Status: AC

## 2024-05-04 NOTE — Telephone Encounter (Signed)
*  STAT* If patient is at the pharmacy, call can be transferred to refill team.   1. Which medications need to be refilled? (please list name of each medication and dose if known) Evolocumab  (REPATHA  SURECLICK) 140 MG/ML SOAJ    2. Would you like to learn more about the convenience, safety, & potential cost savings by using the [facility]? No   3. Are you open to using the [facility] (Type [facility]. ) No   4. Which pharmacy/location (including street and city if local pharmacy) is medication to be sent to?  [facility] - [place], [place] - [address]   5. Do they need a 30 day or 90 day supply? 30 day  Pt out of medication and injection is due today, has scheduled appt on 9/22

## 2024-05-15 NOTE — Progress Notes (Unsigned)
 Cardiology Office Note:  .   Date:  05/16/2024 ID:  Holly Fowler, DOB December 12, 1955, MRN [id number] PCP: Joshua Debby CROME, MD [facility] Providers Cardiologist:  None   Patient Profile: .      PMH: CT calcium  score 405 (93rd percentile) Elevated Lipoprotein a = 247 Dyslipidemia Tobacco use disorder Hypertension Family history heart disease Sister CHF Father - pacemaker, deceased at age 68 Mother - deceased age 63s  Referred to cardiology and seen by me on 05/06/2023 as a new patient consult for elevated coronary calcium  score. 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 Was scheduled for left hip replacement which has been canceled until seen by cardiology. Has been on atorvastatin  for several years for elevated cholesterol. Activity is somewhat limited by hip pain. She does not participate in regular exercise, but reports being very active around her home and helping to care for her 8 y/o grandson. She is able to achieve > 4 METS activity without concerning cardiac symptoms. She does have occasional shortness of breath but unfortunately she continues to smoke. Is working on reducing number of cigarettes per day.  By her description, she seems more limited by hip pain then by shortness of breath.  No chest pain, palpitations, orthopnea, edema, PND, presyncope, syncope. She was on aspirin 81 mg daily and had LDL of 73 on 03/19/23. ASCVD risk score of 16.5% based on history of smoking, hypertension, and hyperlipidemia.  She was encouraged to stop smoking.  She was started on Repatha  140 mg every 14 days.  Was cleared from a cardiac perspective to undergo hip surgery.  Lipid panel completed 03/16/2024 revealed total cholesterol 91, triglycerides 79, HDL 44, LDL 31.     History of Present Illness: .   Discussed the use of AI scribe software for clinical note transcription with the patient, who gave verbal consent to proceed.  History of Present Illness Holly Fowler  is a very pleasant 68 year old female who presents today for follow-up of coronary artery disease. Unfortunately, her husband passed away in 2023/10/09 from a massive heart attack.  He had been hospitalized and found to have lung cancer and was planning to undergo chemotherapy, but unfortunately died prior to starting treatment.  She admits she is struggling with life adjustment since his passing.  She continues to live independently at home. Her daughter and her grandson spend most of the week at her house and she helps to care for her grandson, taking him to and from school. She notes occasional bruising/discomfort following Repatha  injection, but symptoms are mild.  She continues to smoke but has significantly reduced her intake. Quitting has been challenging, especially after the recent loss of her husband.  She notes left arm numbness and tingling that comes and goes.  Discomfort does not worsen with exertion and she has had imaging of her C-spine and told it was likely related.  She also has discomfort in her left scapula area.  She denies chest pain, shortness of breath, orthopnea, PND, edema, palpitations, presyncope, syncope.  She underwent hip surgery last fall and recovered without problem.  She is not exercising on a consistent basis.   ROS: See HPI       Studies Reviewed: .           Risk Assessment/Calculations:         Physical Exam:   VS: BP 130/66 (BP Location: Left Arm, Patient Position: Sitting, Cuff Size: Large)   Pulse 69  Ht 5' 2.5 (1.588 m)   Wt 202 lb 4.8 oz (91.8 kg)   SpO2 98%   BMI 36.41 kg/m   Wt Readings from Last 3 Encounters:  05/16/24 202 lb 4.8 oz (91.8 kg)  03/16/24 201 lb (91.2 kg)  09/17/23 193 lb (87.5 kg)     GEN: Obese, well developed in no acute distress NECK: No JVD; No carotid bruits CARDIAC: RRR, no murmurs, rubs, gallops RESPIRATORY:  Clear to auscultation without rales, wheezing or rhonchi  ABDOMEN: Soft, non-tender,  non-distended EXTREMITIES:  No edema; No deformity     ASSESSMENT AND PLAN: .    CAD: CT calcium  score completed 04/17/23 with Agatston score of 405 (93rd percentile). She continues to feel well with no complaints of chest pain, dyspnea, or other symptoms concerning for angina.  She underwent hip replacement surgery 04/2023 with no cardiac complications.  No indication for further ischemic evaluation at this time.  Went a lengthy discussion about symptoms that are concerning for ischemia.  Recommendation to increase physical activity to aim for at least 150 minutes of moderate intensity exercise each week.  Silver sneakers was suggested, however she wants to give herself more time to adjust to the loss of her husband. She prefers an exercise routine she has done at home in the past. Recommend heart healthy diet avoiding saturated fat, processed foods, simple carbohydrates, and sugar. No bleeding concerns. Continue aspirin 81 mg daily for prevention.  BP is well-controlled.  Lipids are well-controlled.  Continue Repatha , atorvastatin , verapamil .  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  She was started on PCSK9 inhibitor therapy due to elevated LP(a) and LDL was not at goal.   She has achieved significant improvement in lipids with total cholesterol 91, triglycerides 79, HDL 44, and LDL 31.  No concerning side effects on lipid-lowering therapy.  Continue Repatha  140 mg every 14 days and atorvastatin  40 mg daily. Aim to  increase physical activity, and eat a heart healthy whole food based diet avoiding saturated fat, processed foods, simple carbohydrates, and sugar.   Tobacco abuse: Has reduced number of cigarettes per day and is working on further reduction. Information on [email or web address] provided. Complete cessation advised.   Hypertension: BP is well controlled.  Renal function stable on labs completed 03/16/2024.  No change in antihypertensive therapy today.  Continue  verapamil .  Disposition: 1 year with  me  Signed, Rosaline Bane, NP-C

## 2024-05-16 ENCOUNTER — Encounter (HOSPITAL_BASED_OUTPATIENT_CLINIC_OR_DEPARTMENT_OTHER): Payer: Self-pay | Admitting: Nurse Practitioner

## 2024-05-16 ENCOUNTER — Ambulatory Visit (INDEPENDENT_AMBULATORY_CARE_PROVIDER_SITE_OTHER): Admitting: Nurse Practitioner

## 2024-05-16 VITALS — BP 130/66 | HR 69 | Ht 62.5 in | Wt 202.3 lb

## 2024-05-16 DIAGNOSIS — E785 Hyperlipidemia, unspecified: Secondary | ICD-10-CM

## 2024-05-16 DIAGNOSIS — Z7189 Other specified counseling: Secondary | ICD-10-CM | POA: Diagnosis not present

## 2024-05-16 DIAGNOSIS — R931 Abnormal findings on diagnostic imaging of heart and coronary circulation: Secondary | ICD-10-CM

## 2024-05-16 DIAGNOSIS — I2510 Atherosclerotic heart disease of native coronary artery without angina pectoris: Secondary | ICD-10-CM | POA: Diagnosis not present

## 2024-05-16 DIAGNOSIS — Z720 Tobacco use: Secondary | ICD-10-CM | POA: Diagnosis not present

## 2024-05-16 DIAGNOSIS — E7841 Elevated Lipoprotein(a): Secondary | ICD-10-CM

## 2024-05-16 NOTE — Patient Instructions (Addendum)
 Adopting a Healthy Lifestyle.   Weight: Know what a healthy weight is for you (roughly BMI <25) and aim to maintain this. You can calculate your body mass index on your smart phone. Unfortunately, this is not the most accurate measure of healthy weight, but it is the simplest measurement to use. A more accurate measurement involves body scanning which measures lean muscle, fat tissue and bony density. We do not have this equipment at [facility]    Diet: Aim for 7+ servings of fruits and vegetables daily Limit animal fats in diet for cholesterol and heart health - choose grass fed whenever available Avoid highly processed foods (fast food burgers, tacos, fried chicken, pizza, hot dogs, french fries)  Saturated fat comes in the form of butter, lard, coconut oil, margarine, partially hydrogenated oils, and fat in meat. These increase your risk of cardiovascular disease.  Use healthy plant oils, such as olive, canola, soy, corn, sunflower and peanut.  Whole foods such as fruits, vegetables and whole grains have fiber  Men need > 38 grams of fiber per day Women need > 25 grams of fiber per day  Load up on vegetables and fruits - one-half of your plate: Aim for color and variety, and remember that potatoes dont count. Go for whole grains - one-quarter of your plate: Whole wheat, barley, wheat berries, quinoa, oats, brown rice, and foods made with them. If you want pasta, go with whole wheat pasta. Protein power - one-quarter of your plate: Fish, chicken, beans, and nuts are all healthy, versatile protein sources. Limit red meat. You need carbohydrates for energy! The type of carbohydrate is more important than the amount. Choose carbohydrates such as vegetables, fruits, whole grains, beans, and nuts in the place of white rice, white pasta, potatoes (baked or fried), macaroni and cheese, cakes, cookies, and donuts.  If youre thirsty, drink water. Coffee and tea are good in moderation, but skip sugary  drinks and limit milk and dairy products to one or two daily servings. Keep sugar intake at 6 teaspoons or 24 grams or LESS       Exercise: Aim for 150 min of moderate intensity exercise weekly for heart health, and weights twice weekly for bone health Stay active - any steps are better than no steps! Aim for 7-9 hours of sleep daily      Medication Instructions:   Your physician recommends that you continue on your current medications as directed. Please refer to the Current Medication list given to you today.   *If you need a refill on your cardiac medications before your next appointment, please call your pharmacy*  Lab Work:  None ordered.  If you have labs (blood work) drawn today and your tests are completely normal, you will receive your results only by: MyChart Message (if you have MyChart) OR A paper copy in the mail If you have any lab test that is abnormal or we need to change your treatment, we will call you to review the results.  Testing/Procedures:  None ordered.  Follow-Up: At [facility], you and your health needs are our priority.  As part of our continuing mission to provide you with exceptional heart care, our providers are all part of one team.  This team includes your primary Cardiologist (physician) and Advanced Practice Providers or APPs (Physician Assistants and Nurse Practitioners) who all work together to provide you with the care you need, when you need it.  Your next appointment:   1 year(s)  Provider:  Rosaline Bane, NP    We recommend signing up for the patient portal called MyChart.  Sign up information is provided on this After Visit Summary.  MyChart is used to connect with patients for Virtual Visits (Telemedicine).  Patients are able to view lab/test results, encounter notes, upcoming appointments, etc.  Non-urgent messages can be sent to your provider as well.   To learn more about what you can do with MyChart, go to  [email or web address].   Other Instructions  Your physician wants you to follow-up in: 1 year.  You will receive a reminder letter in the mail two months in advance. If you don't receive a letter, please call our office to schedule the follow-up appointment.

## 2024-07-09 ENCOUNTER — Other Ambulatory Visit: Payer: Self-pay | Admitting: Internal Medicine

## 2024-07-09 DIAGNOSIS — I10 Essential (primary) hypertension: Secondary | ICD-10-CM

## 2024-07-12 ENCOUNTER — Telehealth: Payer: Self-pay

## 2024-07-12 NOTE — Telephone Encounter (Signed)
 Copied from CRM [id number]. Topic: Clinical - Medication Question >> Jul 12, 2024 12:22 PM Shereese L wrote: Reason for CRM: Patient is calling for the status of her medication indapamide  (LOZOL ) 1.25 MG tablet and was wanting a call back

## 2024-07-15 NOTE — Telephone Encounter (Signed)
 Medication was refilled.

## 2024-08-03 ENCOUNTER — Other Ambulatory Visit: Payer: Self-pay | Admitting: Internal Medicine

## 2024-08-03 DIAGNOSIS — E785 Hyperlipidemia, unspecified: Secondary | ICD-10-CM

## 2024-08-03 DIAGNOSIS — I10 Essential (primary) hypertension: Secondary | ICD-10-CM

## 2024-09-08 ENCOUNTER — Ambulatory Visit

## 2024-09-08 DIAGNOSIS — Z0000 Encounter for general adult medical examination without abnormal findings: Secondary | ICD-10-CM

## 2024-09-08 NOTE — Patient Instructions (Signed)
 Holly Fowler,  Thank you for taking the time for your Medicare Wellness Visit. I appreciate your continued commitment to your health goals. Please review the care plan we discussed, and feel free to reach out if I can assist you further.  Please note that Annual Wellness Visits do not include a physical exam. Some assessments may be limited, especially if the visit was conducted virtually. If needed, we may recommend an in-person follow-up with your provider.  Ongoing Care Seeing your primary care provider every 3 to 6 months helps us  monitor your health and provide consistent, personalized care.   Referrals If a referral was made during today's visit and you haven't received any updates within two weeks, please contact the referred provider directly to check on the status.  Recommended Screenings:  Health Maintenance  Topic Date Due   Screening for Lung Cancer  Never done   Zoster (Shingles) Vaccine (1 of 2) Never done   Osteoporosis screening with Bone Density Scan  Never done   DTaP/Tdap/Td vaccine (2 - Td or Tdap) 01/12/2023   Flu Shot  03/25/2024   COVID-19 Vaccine (3 - 2025-26 season) 04/25/2024   Colon Cancer Screening  05/18/2024   Medicare Annual Wellness Visit  09/13/2024   Breast Cancer Screening  04/28/2025   Pneumococcal Vaccine for age over 91 (4 of 4 - PCV20 or PCV21) 02/19/2026   Hepatitis C Screening  Completed   Meningitis B Vaccine  Aged Out       09/08/2024   11:07 AM  Advanced Directives  Does Patient Have a Medical Advance Directive? No  Would patient like information on creating a medical advance directive? No - Patient declined    Vision: Annual vision screenings are recommended for early detection of glaucoma, cataracts, and diabetic retinopathy. These exams can also reveal signs of chronic conditions such as diabetes and high blood pressure.  Dental: Annual dental screenings help detect early signs of oral cancer, gum disease, and other conditions linked  to overall health, including heart disease and diabetes.  Please see the attached documents for additional preventive care recommendations.

## 2024-09-08 NOTE — Progress Notes (Signed)
 "  Chief Complaint  Patient presents with   Medicare Wellness     Subjective:   Holly Fowler is a 69 y.o. female who presents for a Medicare Annual Wellness Visit.  Visit info / Clinical Intake: Medicare Wellness Visit Type:: Subsequent Annual Wellness Visit Persons participating in visit and providing information:: patient Medicare Wellness Visit Mode:: Video Since this visit was completed virtually, some vitals may be partially provided or unavailable. Missing vitals are due to the limitations of the virtual format.: Unable to obtain vitals - no equipment If Telephone or Video please confirm:: I connected with patient using audio/video enable telemedicine. I verified patient identity with two identifiers, discussed telehealth limitations, and patient agreed to proceed. Patient Location:: home Provider Location:: office Interpreter Needed?: No Pre-visit prep was completed: yes AWV questionnaire completed by patient prior to visit?: no Living arrangements:: with family/others Patient's Overall Health Status Rating: very good Typical amount of pain: none Does pain affect daily life?: no Are you currently prescribed opioids?: no  Dietary Habits and Nutritional Risks How many meals a day?: 2 Eats fruit and vegetables daily?: (!) no Most meals are obtained by: preparing own meals In the last 2 weeks, have you had any of the following?: none Diabetic:: no  Functional Status Activities of Daily Living (to include ambulation/medication): Independent Ambulation: Independent Home Assistive Devices/Equipment: Contact lenses; Eyeglasses; Cane Medication Administration: Independent Home Management (perform basic housework or laundry): Independent Manage your own finances?: yes Primary transportation is: driving Concerns about vision?: no *vision screening is required for WTM* Concerns about hearing?: no  Fall Screening Falls in the past year?: 0 Number of falls in past year: 0 Was  there an injury with Fall?: 0 Fall Risk Category Calculator: 0 Patient Fall Risk Level: Low Fall Risk  Fall Risk Patient at Risk for Falls Due to: Medication side effect Fall risk Follow up: Falls prevention discussed; Falls evaluation completed; Education provided  Home and Transportation Safety: All rugs have non-skid backing?: yes All stairs or steps have railings?: yes Grab bars in the bathtub or shower?: (!) no Have non-skid surface in bathtub or shower?: yes Good home lighting?: yes Regular seat belt use?: yes Hospital stays in the last year:: no  Cognitive Assessment Difficulty concentrating, remembering, or making decisions? : no Will 6CIT or Mini Cog be Completed: yes What year is it?: 0 points What month is it?: 0 points Give patient an address phrase to remember (5 components): [address] [place] About what time is it?: 0 points Count backwards from 20 to 1: 0 points Say the months of the year in reverse: 0 points Repeat the address phrase from earlier: 2 points 6 CIT Score: 2 points  Advance Directives (For Healthcare) Does Patient Have a Medical Advance Directive?: No Would patient like information on creating a medical advance directive?: No - Patient declined  Reviewed/Updated  Reviewed/Updated: Reviewed All (Medical, Surgical, Family, Medications, Allergies, Care Teams, Patient Goals)    Allergies (verified) Penicillins   Current Medications (verified) Outpatient Encounter Medications as of 09/08/2024  Medication Sig   aspirin EC 81 MG tablet Take 81 mg by mouth daily. Swallow whole.   atorvastatin  (LIPITOR) 40 MG tablet Take 1 tablet by mouth once daily   Evolocumab  (REPATHA  SURECLICK) 140 MG/ML SOAJ Inject 140 mg into the skin every 14 (fourteen) days.   indapamide  (LOZOL ) 1.25 MG tablet Take 1 tablet by mouth once daily   levocetirizine (XYZAL ) 5 MG tablet Take 1 tablet (5 mg  total) by mouth every evening.   simethicone  (GAS-X) 80 MG chewable  tablet Chew 1 tablet (80 mg total) by mouth every 6 (six) hours as needed for flatulence.   verapamil  (CALAN -SR) 240 MG CR tablet TAKE 1 TABLET BY MOUTH AT BEDTIME   Azelastine -Fluticasone  137-50 MCG/ACT SUSP Place 1 spray into the nose every 12 (twelve) hours. (Patient not taking: Reported on 09/08/2024)   No facility-administered encounter medications on file as of 09/08/2024.    History: Past Medical History:  Diagnosis Date   Hypertension    Past Surgical History:  Procedure Laterality Date   ADENOIDECTOMY  08/25/1969   TOTAL HIP ARTHROPLASTY Right 04/07/2020   TOTAL HIP ARTHROPLASTY Left 06/15/2023   Family History  Problem Relation Age of Onset   Hypertension Mother    Hyperlipidemia Mother    Hypertension Father    Hyperlipidemia Father    Hypertension Sister    Hyperlipidemia Sister    Hypertension Brother    Hyperlipidemia Brother    Cancer Neg Hx    Diabetes Neg Hx    Stroke Neg Hx    Early death Neg Hx    Heart disease Neg Hx    Kidney disease Neg Hx    Social History   Occupational History   Occupation: Retired    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  Tobacco Use   Smoking status: Every Day    Current packs/day: 0.50    Average packs/day: 0.5 packs/day for 40.0 years (20.0 ttl pk-yrs)    Types: Cigarettes    Passive exposure: Current   Smokeless tobacco: Never  Vaping Use   Vaping status: Never Used  Substance and Sexual Activity   Alcohol use: No   Drug use: No   Sexual activity: Not on file   Tobacco Counseling Ready to quit: Not Answered Counseling given: Not Answered  SDOH Screenings   Food Insecurity: No Food Insecurity (09/08/2024)  Housing: Unknown (09/08/2024)  Transportation Needs: No Transportation Needs (09/08/2024)  Utilities: Not At Risk (09/08/2024)  Alcohol Screen: Low Risk (09/08/2024)  Depression (PHQ2-9): Low Risk (09/08/2024)  Financial Resource Strain: Low Risk (09/08/2024)  Physical Activity: Insufficiently  Active (09/08/2024)  Social Connections: Moderately Integrated (09/08/2024)  Stress: No Stress Concern Present (09/08/2024)  Tobacco Use: High Risk (09/08/2024)  Health Literacy: Adequate Health Literacy (09/08/2024)   See flowsheets for full screening details  Depression Screen PHQ 2 & 9 Depression Scale- Over the past 2 weeks, how often have you been bothered by any of the following problems? Little interest or pleasure in doing things: 0 Feeling down, depressed, or hopeless (PHQ Adolescent also includes...irritable): 0 PHQ-2 Total Score: 0 Trouble falling or staying asleep, or sleeping too much: 0 Feeling tired or having little energy: 0 Poor appetite or overeating (PHQ Adolescent also includes...weight loss): 0 Feeling bad about yourself - or that you are a failure or have let yourself or your family down: 0 Trouble concentrating on things, such as reading the newspaper or watching television (PHQ Adolescent also includes...like school work): 0 Moving or speaking so slowly that other people could have noticed. Or the opposite - being so fidgety or restless that you have been moving around a lot more than usual: 0 Thoughts that you would be better off dead, or of hurting yourself in some way: 0 PHQ-9 Total Score: 0 If you checked off any problems, how difficult have these problems made it for you to do your work, take care of things at home,  or get along with other people?: Not difficult at all  Depression Treatment Depression Interventions/Treatment : EYV7-0 Score <4 Follow-up Not Indicated     Goals Addressed               This Visit's Progress     stay healthy (pt-stated)               Objective:    Today's Vitals   There is no height or weight on file to calculate BMI.  Hearing/Vision screen Vision Screening - Comments:: No regular eye exams Immunizations and Health Maintenance Health Maintenance  Topic Date Due   Lung Cancer Screening  Never done   Zoster  Vaccines- Shingrix  (1 of 2) Never done   Bone Density Scan  Never done   DTaP/Tdap/Td (2 - Td or Tdap) 01/12/2023   Influenza Vaccine  03/25/2024   COVID-19 Vaccine (3 - 2025-26 season) 04/25/2024   Colonoscopy  05/18/2024   Mammogram  04/28/2025   Medicare Annual Wellness (AWV)  09/08/2025   Pneumococcal Vaccine: 50+ Years (4 of 4 - PCV20 or PCV21) 02/19/2026   Hepatitis C Screening  Completed   Meningococcal B Vaccine  Aged Out        Assessment/Plan:  This is a routine wellness examination for [place].  Patient Care Team: Joshua Debby CROME, MD as PCP - General (Internal Medicine) [place], [place], [place] as Referring Physician (Optometry) Swinyer, Rosaline HERO, NP as Nurse Practitioner (Cardiology)  I have personally reviewed and noted the following in the patients chart:   Medical and social history Use of alcohol, tobacco or illicit drugs  Current medications and supplements including opioid prescriptions. Functional ability and status Nutritional status Physical activity Advanced directives List of other physicians Hospitalizations, surgeries, and ER visits in previous 12 months Vitals Screenings to include cognitive, depression, and falls Referrals and appointments  No orders of the defined types were placed in this encounter.  In addition, I have reviewed and discussed with patient certain preventive protocols, quality metrics, and best practice recommendations. A written personalized care plan for preventive services as well as general preventive health recommendations were provided to patient.   Ardella FORBES Dawn, LPN   8/84/7973   Return in 1 year (on 09/08/2025).  After Visit Summary: (MyChart) Due to this being a telephonic visit, the after visit summary with patients personalized plan was offered to patient via MyChart   Nurse Notes: Screenings not ordered: Declined Referral/Order for colonoscopy, lung cancer screening and bone density at this time HM Addressed: States  will get flu vaccine at next appointment. Due for TDAP and covid vaccine.  "

## 2024-09-19 ENCOUNTER — Ambulatory Visit: Admitting: Internal Medicine

## 2024-10-12 ENCOUNTER — Ambulatory Visit: Admitting: Internal Medicine
